# Patient Record
Sex: Female | Born: 1970
Health system: Southern US, Community
[De-identification: ages and names within clinical notes are randomized; demographics above are authoritative.]

## PROBLEM LIST (undated history)

## (undated) DIAGNOSIS — I8390 Asymptomatic varicose veins of unspecified lower extremity: Secondary | ICD-10-CM

## (undated) DIAGNOSIS — I491 Atrial premature depolarization: Secondary | ICD-10-CM

## (undated) DIAGNOSIS — I10 Essential (primary) hypertension: Secondary | ICD-10-CM

## (undated) HISTORY — DX: Atrial premature depolarization: I49.1

## (undated) HISTORY — DX: Essential (primary) hypertension: I10

## (undated) HISTORY — DX: Asymptomatic varicose veins of unspecified lower extremity: I83.90

---

## 2004-03-14 ENCOUNTER — Emergency Department: Payer: Self-pay | Admitting: Emergency Medicine

## 2006-01-17 ENCOUNTER — Emergency Department: Payer: Self-pay | Admitting: Emergency Medicine

## 2008-11-04 DIAGNOSIS — I10 Essential (primary) hypertension: Secondary | ICD-10-CM | POA: Insufficient documentation

## 2012-02-02 ENCOUNTER — Emergency Department: Payer: Self-pay | Admitting: Emergency Medicine

## 2012-02-02 LAB — CBC
HCT: 42.2 % (ref 35.0–47.0)
HGB: 14.7 g/dL (ref 12.0–16.0)
MCH: 33.9 pg (ref 26.0–34.0)
MCHC: 34.9 g/dL (ref 32.0–36.0)
MCV: 97 fL (ref 80–100)
RDW: 12.3 % (ref 11.5–14.5)

## 2012-02-02 LAB — COMPREHENSIVE METABOLIC PANEL
Alkaline Phosphatase: 62 U/L (ref 50–136)
Anion Gap: 6 — ABNORMAL LOW (ref 7–16)
BUN: 9 mg/dL (ref 7–18)
Bilirubin,Total: 0.9 mg/dL (ref 0.2–1.0)
Chloride: 104 mmol/L (ref 98–107)
Co2: 28 mmol/L (ref 21–32)
Creatinine: 0.82 mg/dL (ref 0.60–1.30)
EGFR (African American): >60
EGFR (Non-African Amer.): >60
Osmolality: 274 (ref 275–301)
Potassium: 3.4 mmol/L — ABNORMAL LOW (ref 3.5–5.1)
SGOT(AST): 22 U/L (ref 15–37)
Sodium: 138 mmol/L (ref 136–145)
Total Protein: 7.6 g/dL (ref 6.4–8.2)

## 2012-02-02 LAB — TROPONIN I: Troponin-I: <0.02 ng/mL

## 2012-02-02 LAB — CK TOTAL AND CKMB (NOT AT ARMC)
CK, Total: 50 U/L (ref 21–215)
CK-MB: <0.5 ng/mL — ABNORMAL LOW (ref 0.5–3.6)

## 2012-02-05 ENCOUNTER — Telehealth: Payer: Self-pay

## 2012-02-05 ENCOUNTER — Ambulatory Visit (INDEPENDENT_AMBULATORY_CARE_PROVIDER_SITE_OTHER): Payer: PRIVATE HEALTH INSURANCE | Admitting: Cardiovascular Disease

## 2012-02-05 ENCOUNTER — Encounter: Payer: Self-pay | Admitting: Cardiovascular Disease

## 2012-02-05 VITALS — BP 140/90 | HR 72 | Ht 64.0 in | Wt 132.2 lb

## 2012-02-05 DIAGNOSIS — R000 Tachycardia, unspecified: Secondary | ICD-10-CM

## 2012-02-05 DIAGNOSIS — R0789 Other chest pain: Secondary | ICD-10-CM

## 2012-02-05 DIAGNOSIS — R0602 Shortness of breath: Secondary | ICD-10-CM

## 2012-02-05 DIAGNOSIS — R6889 Other general symptoms and signs: Secondary | ICD-10-CM

## 2012-02-05 DIAGNOSIS — F411 Generalized anxiety disorder: Secondary | ICD-10-CM

## 2012-02-05 DIAGNOSIS — F419 Anxiety disorder, unspecified: Secondary | ICD-10-CM | POA: Insufficient documentation

## 2012-02-05 DIAGNOSIS — R0989 Other specified symptoms and signs involving the circulatory and respiratory systems: Secondary | ICD-10-CM

## 2012-02-05 MED ORDER — PROPRANOLOL HCL 20 MG PO TABS: 20.0000 mg | ORAL_TABLET | Freq: Three times a day (TID) | ORAL | Status: DC | PRN

## 2012-02-05 NOTE — Assessment & Plan Note (Signed)
By her description, less concerning for arrhythmia, more consistent with atrial tachycardia from anxiety. We will try low-dose beta blocker as needed.

## 2012-02-05 NOTE — Telephone Encounter (Signed)
Pt is a new pt to [place], ER f/u Has appt with Dr. Kirke Corin 12/3 She is asking if she can be seen sooner than this Says she went back to ER for CP and palpitations Says she is ok with waiting until 12/3 but was wanting to know if we had anything sooner Denies active CP Admits to a "pinching" sensation Offered her an appt with Dr. Kirke Corin tomm at 2 pm in [place] She asks if Dr. Mariah Milling has anything here in [place] I had her hold while we asked Dr. Mariah Milling when he clould see pt appt was made available for pt today at 2 pm She confirms and will come for this appt

## 2012-02-05 NOTE — Assessment & Plan Note (Signed)
I suspect most of her symptoms are secondary to anxiety. She has significant stress including home stress, work stress, now with school stress. We have suggested she take propranolol as needed for palpitations/tachycardia and shortness of breath. If symptoms get worse, she may benefit from having Xanax or Ativan when necessary. We have suggested she reconsider taking 5 classes next semester. This semester she is only taking 3 classes and it is very stressful.

## 2012-02-05 NOTE — Assessment & Plan Note (Signed)
Shortness of breath seems to be periodic, situational, associated with stress/anxiety and faster heartbeat. No further workup at this time. We did offer Holter and echocardiogram. We could do these at a later date if symptoms get worse. We have suggested she try low-dose beta blocker/propranolol when necessary.

## 2012-02-05 NOTE — Progress Notes (Signed)
Patient ID: Erin [name], female    DOB: 04/27/1970, 41 y.o.   MRN: [id number]  HPI Comments: Erin [name] is a very pleasant 41 year old woman with chronic lurched remedy varicose veins, anxiety who presents by referral from Dr. Carlynn [name] for evaluation of shortness of breath and palpitations.  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 She has significant stress from her schedule. She would like to get good grades and is working very hard.  She reports having episodes of heart racing sometimes lasting up to one hour. The fast heart rate seems to be associated with shortness of breath. Occasional palpitations. It seems like her symptoms are happening more and more, now on a daily basis. She does report stress from school seems to be getting worse. She also describes a pinching in her chest, sometimes in her back.  She's able to exert herself without any significant difficulty. No chest pain or shortness of breath with normal exertion. She reports her cholesterol is relatively well controlled. No smoking, no diabetes.  She reports her brother had heart attack at age 81, father with MI in his 5s, mother died early from cancer, grandmother lived into her 63s.  EKG shows normal sinus rhythm with rate 73 beats a minute with no significant ST or T wave changes   Outpatient Encounter Prescriptions as of 02/05/2012  Medication Sig Dispense Refill  . esomeprazole (NEXIUM) 40 MG capsule Take 40 mg by mouth daily before breakfast.      . hydrochlorothiazide (MICROZIDE) 12.5 MG capsule Take 12.5 mg by mouth daily.      . Multiple Vitamin (MULTIVITAMIN) tablet Take 1 tablet by mouth daily.        Review of Systems  Constitutional: Negative.   HENT: Negative.   Eyes: Negative.   Respiratory: Positive for shortness of breath.   Cardiovascular: Positive for palpitations.  Gastrointestinal: Negative.     Musculoskeletal: Negative.   Skin: Negative.   Neurological: Negative.   Hematological: Negative.   Psychiatric/Behavioral: The patient is nervous/anxious.   All other systems reviewed and are negative.    BP 140/90  Pulse 72  Ht 5\' 4"  (1.626 m)  Wt 132 lb 4 oz (59.988 kg)  BMI 22.70 kg/m2  Physical Exam  Nursing note and vitals reviewed. Constitutional: She is oriented to person, place, and time. She appears well-developed and well-nourished.  HENT:  Head: Normocephalic.  Nose: Nose normal.  Mouth/Throat: Oropharynx is clear and moist.  Eyes: Conjunctivae normal are normal. Pupils are equal, round, and reactive to light.  Neck: Normal range of motion. Neck supple. No JVD present.  Cardiovascular: Normal rate, regular rhythm, S1 normal, S2 normal, normal heart sounds and intact distal pulses.  Exam reveals no gallop and no friction rub.   No murmur heard. Pulmonary/Chest: Effort normal and breath sounds normal. No respiratory distress. She has no wheezes. She has no rales. She exhibits no tenderness.  Abdominal: Soft. Bowel sounds are normal. She exhibits no distension. There is no tenderness.  Musculoskeletal: Normal range of motion. She exhibits no edema and no tenderness.  Lymphadenopathy:    She has no cervical adenopathy.  Neurological: She is alert and oriented to person, place, and time. Coordination normal.  Skin: Skin is warm and dry. No rash noted. No erythema.  Psychiatric: She has a normal mood and affect. Her behavior is normal. Judgment and thought content normal.  Assessment and Plan

## 2012-02-05 NOTE — Patient Instructions (Addendum)
Please start propranolol as needed every 6 hours for fast heart rates Please try a 1/2 to start  Call the office if you would like a holter monitor to treadmill test or other work up  Please call us if you have new issues that need to be addressed before your next appt.

## 2012-02-12 ENCOUNTER — Ambulatory Visit: Payer: Self-pay | Admitting: Cardiovascular Disease

## 2012-05-05 LAB — HM PAP SMEAR: HM PAP: NORMAL

## 2014-12-23 ENCOUNTER — Ambulatory Visit: Payer: Self-pay | Admitting: Family Medicine

## 2015-02-15 ENCOUNTER — Encounter: Payer: Self-pay | Admitting: Family Medicine

## 2015-03-03 ENCOUNTER — Ambulatory Visit: Payer: Self-pay | Admitting: Family Medicine

## 2015-05-27 ENCOUNTER — Ambulatory Visit (INDEPENDENT_AMBULATORY_CARE_PROVIDER_SITE_OTHER): Payer: 59 | Admitting: Family Medicine

## 2015-05-27 ENCOUNTER — Encounter: Payer: Self-pay | Admitting: Family Medicine

## 2015-05-27 VITALS — BP 142/78 | HR 87 | Temp 97.8°F | Resp 18 | Ht 62.0 in | Wt 134.7 lb

## 2015-05-27 DIAGNOSIS — Z0000 Encounter for general adult medical examination without abnormal findings: Secondary | ICD-10-CM | POA: Diagnosis not present

## 2015-05-27 DIAGNOSIS — Z8679 Personal history of other diseases of the circulatory system: Secondary | ICD-10-CM | POA: Insufficient documentation

## 2015-05-27 DIAGNOSIS — Z1239 Encounter for other screening for malignant neoplasm of breast: Secondary | ICD-10-CM

## 2015-05-27 DIAGNOSIS — M797 Fibromyalgia: Secondary | ICD-10-CM

## 2015-05-27 DIAGNOSIS — M542 Cervicalgia: Secondary | ICD-10-CM | POA: Diagnosis not present

## 2015-05-27 DIAGNOSIS — Z13220 Encounter for screening for lipoid disorders: Secondary | ICD-10-CM | POA: Diagnosis not present

## 2015-05-27 DIAGNOSIS — I491 Atrial premature depolarization: Secondary | ICD-10-CM | POA: Insufficient documentation

## 2015-05-27 DIAGNOSIS — D229 Melanocytic nevi, unspecified: Secondary | ICD-10-CM

## 2015-05-27 DIAGNOSIS — Z124 Encounter for screening for malignant neoplasm of cervix: Secondary | ICD-10-CM

## 2015-05-27 DIAGNOSIS — I10 Essential (primary) hypertension: Secondary | ICD-10-CM | POA: Diagnosis not present

## 2015-05-27 DIAGNOSIS — E559 Vitamin D deficiency, unspecified: Secondary | ICD-10-CM | POA: Diagnosis not present

## 2015-05-27 DIAGNOSIS — G8929 Other chronic pain: Secondary | ICD-10-CM | POA: Diagnosis not present

## 2015-05-27 DIAGNOSIS — Z01419 Encounter for gynecological examination (general) (routine) without abnormal findings: Secondary | ICD-10-CM

## 2015-05-27 MED ORDER — METAXALONE 800 MG PO TABS: 800.0000 mg | ORAL_TABLET | Freq: Three times a day (TID) | ORAL | Status: DC

## 2015-05-27 MED ORDER — HYDROCHLOROTHIAZIDE 12.5 MG PO CAPS: 12.5000 mg | ORAL_CAPSULE | Freq: Every day | ORAL | Status: DC

## 2015-05-27 NOTE — Progress Notes (Signed)
Name: Erin [name]   MRN: [id number]    DOB: 07/20/1970   Date:05/27/2015       Progress Note  Subjective  Chief Complaint  Chief Complaint  Patient presents with  . Annual Exam  . Hypertension    HPI  Well Woman: she denies vaginal discharge, she states only occasionally when she sneezes she may have a little incontinence. No dyspareunia. No breast lumps  HTN: she has been out of medication for months now, she checks her bp occasionally and it can up to 160's when she is upset. She states palpitation has resolved, no longer taking Propanolol  Chronic neck pain: she has been taking Baclofen prn, but the stiffness is also during the day, constant discomfort sensation on right side of the neck, worse when she turns head to the right side, she denies numbness or tingling. No weakness on her arm. No trauma  Family history of melanoma: mother died at age 80 from complications of melanoma. Raised by grandmother until father remarried , after that lived with step-mother and father.   Patient Active Problem List   Diagnosis Date Noted  . APC (atrial premature contractions) 05/27/2015  . History of varicose veins 05/27/2015  . Vitamin D deficiency 05/27/2015  . Chronic neck pain 05/27/2015  . Fibromyalgia 05/27/2015  . Tachycardia 02/05/2012  . Essential (primary) hypertension 11/04/2008    Past Surgical History  Procedure Laterality Date  . Cesarean section      Family History  Problem Relation Age of Onset  . Heart attack Father 18  . Hypertension Father   . Heart attack Brother 80    s/p stent   . Hyperlipidemia Brother   . Hypertension Brother   . Melanoma Mother     Social History   Social History  . Marital Status: Married    Spouse Name: N/A  . Number of Children: N/A  . Years of Education: N/A   Occupational History  . Not on file.   Social History Main Topics  . Smoking status: Never Smoker   . Smokeless tobacco: Never Used  . Alcohol Use: No  .  Drug Use: No  . Sexual Activity:    Partners: Male   Other Topics Concern  . Not on file   Social History Narrative     Current outpatient prescriptions:  .  hydrochlorothiazide (MICROZIDE) 12.5 MG capsule, Take 1 capsule (12.5 mg total) by mouth daily., Disp: 90 capsule, Rfl: 1 .  Multiple Vitamin (MULTIVITAMIN) tablet, Take 1 tablet by mouth daily., Disp: , Rfl:  .  metaxalone (SKELAXIN) 800 MG tablet, Take 1 tablet (800 mg total) by mouth 3 (three) times daily., Disp: 90 tablet, Rfl: 2  No Known Allergies   ROS  Constitutional: Negative for fever or weight change.  Respiratory: Negative for cough and shortness of breath.   Cardiovascular: Negative for chest pain or palpitations.  Gastrointestinal: Negative for abdominal pain, no bowel changes.  Musculoskeletal: Negative for gait problem or joint swelling.  Skin: Negative for rash.  Neurological: Negative for dizziness or headache.  No other specific complaints in a complete review of systems (except as listed in HPI above).  Objective  Filed Vitals:   05/27/15 1411  BP: 142/78  Pulse: 87  Temp: 97.8 F (36.6 C)  TempSrc: Oral  Resp: 18  Height: 5\' 2"  (1.575 m)  Weight: 134 lb 11.2 oz (61.1 kg)  SpO2: 96%    Body mass index is 24.63 kg/(m^2).  Physical Exam  Constitutional: Patient appears well-developed and well-nourished. No distress.  HENT: Head: Normocephalic and atraumatic. Ears: B TMs ok, no erythema or effusion; Nose: Nose normal. Mouth/Throat: Oropharynx is clear and moist. No oropharyngeal exudate.  Eyes: Conjunctivae and EOM are normal. Pupils are equal, round, and reactive to light. No scleral icterus.  Neck: Normal range of motion. Neck supple. No JVD present. No thyromegaly present.  Cardiovascular: Normal rate, regular rhythm and normal heart sounds.  No murmur heard. No BLE edema. Pulmonary/Chest: Effort normal and breath sounds normal. No respiratory distress. Abdominal: Soft. Bowel sounds are  normal, no distension. There is no tenderness. no masses Breast: no lumps or masses, no nipple discharge or rashes FEMALE GENITALIA:  External genitalia normal External urethra normal Vaginal vault normal without discharge or lesions Cervix normal without discharge or lesions Bimanual exam normal without masses RECTAL: not done Musculoskeletal: Normal range of motion, no joint effusions. No gross deformities Neurological: he is alert and oriented to person, place, and time. No cranial nerve deficit. Coordination, balance, strength, speech and gait are normal.  Skin: Skin is warm and dry. No rash noted. No erythema. She has multiple moles Psychiatric: Patient has a normal mood and affect. behavior is normal. Judgment and thought content normal.   PHQ2/9: Depression screen PHQ 2/9 05/27/2015  Decreased Interest 0  Down, Depressed, Hopeless 0  PHQ - 2 Score 0     Fall Risk: Fall Risk  05/27/2015  Falls in the past year? No    Functional Status Survey: Is the patient deaf or have difficulty hearing?: No Does the patient have difficulty seeing, even when wearing glasses/contacts?: No Does the patient have difficulty concentrating, remembering, or making decisions?: No Does the patient have difficulty walking or climbing stairs?: No Does the patient have difficulty dressing or bathing?: No Does the patient have difficulty doing errands alone such as visiting a doctor's office or shopping?: No    Assessment & Plan  1. Well woman exam  Discussed importance of 150 minutes of physical activity weekly, eat two servings of fish weekly, eat one serving of tree nuts ( cashews, pistachios, pecans, almonds.Marland Kitchen) every other day, eat 6 servings of fruit/vegetables daily and drink plenty of water and avoid sweet beverages.  I also discussed importance of dairy intake and symptoms of menopause  2. Chronic neck pain  - metaxalone (SKELAXIN) 800 MG tablet; Take 1 tablet (800 mg total) by mouth 3  (three) times daily.  Dispense: 90 tablet; Refill: 2 - Ambulatory referral to Chiropractic  3. Essential (primary) hypertension  - hydrochlorothiazide (MICROZIDE) 12.5 MG capsule; Take 1 capsule (12.5 mg total) by mouth daily.  Dispense: 90 capsule; Refill: 1 - CBC with Differential/Platelet - Comprehensive metabolic panel  4. Vitamin D deficiency  - VITAMIN D 25 Hydroxy (Vit-D Deficiency, Fractures)  5. Fibromyalgia  - metaxalone (SKELAXIN) 800 MG tablet; Take 1 tablet (800 mg total) by mouth 3 (three) times daily.  Dispense: 90 tablet; Refill: 2  6. Cervical cancer screening  - Pap IG, CT/NG NAA, and HPV (high risk)  7. Lipid screening  - Lipid panel  8. Nevus  - Ambulatory referral to Dermatology  9. Breast cancer screening  - MM Digital Screening; Future

## 2015-06-01 LAB — PAP IG, CT-NG NAA, HPV HIGH-RISK
CHLAMYDIA, NUC. ACID AMP: NEGATIVE
Gonococcus by Nucleic Acid Amp: NEGATIVE
HPV, high-risk: NEGATIVE
PAP SMEAR COMMENT: 0

## 2015-06-30 ENCOUNTER — Telehealth: Payer: Self-pay | Admitting: Family Medicine

## 2015-06-30 MED ORDER — BACLOFEN 20 MG PO TABS: 20.0000 mg | ORAL_TABLET | Freq: Three times a day (TID) | ORAL | Status: DC

## 2015-06-30 NOTE — Telephone Encounter (Signed)
done

## 2015-06-30 NOTE — Telephone Encounter (Signed)
Pt called wanting to know if she can be switched back to the Baclofen. Pt states what she is currently on she does not the way it makes her feel. Please advise.

## 2015-10-24 ENCOUNTER — Encounter: Payer: Self-pay | Admitting: Family Medicine

## 2015-10-24 ENCOUNTER — Ambulatory Visit (INDEPENDENT_AMBULATORY_CARE_PROVIDER_SITE_OTHER): Payer: 59 | Admitting: Family Medicine

## 2015-10-24 VITALS — BP 118/78 | HR 94 | Temp 98.0°F | Resp 18 | Wt 126.4 lb

## 2015-10-24 DIAGNOSIS — R300 Dysuria: Secondary | ICD-10-CM | POA: Diagnosis not present

## 2015-10-24 LAB — POCT URINALYSIS DIPSTICK
Bilirubin, UA: NEGATIVE
Glucose, UA: NEGATIVE
KETONES UA: NEGATIVE
LEUKOCYTES UA: NEGATIVE
Nitrite, UA: NEGATIVE
PH UA: 5.5
PROTEIN UA: NEGATIVE
RBC UA: NEGATIVE
SPEC GRAV UA: 1.01
Urobilinogen, UA: 2

## 2015-10-24 MED ORDER — CIPROFLOXACIN HCL 250 MG PO TABS: 250.0000 mg | ORAL_TABLET | Freq: Two times a day (BID) | ORAL | 0 refills | Status: DC

## 2015-10-24 NOTE — Addendum Note (Signed)
Addended by: Lolita Rieger D on: 10/24/2015 04:04 PM   Modules accepted: Orders

## 2015-10-24 NOTE — Progress Notes (Signed)
Name: Erin [name]   MRN: [id number]    DOB: 26-Sep-1970   Date:10/24/2015       Progress Note  Subjective  Chief Complaint  Chief Complaint  Patient presents with  . Dysuria    painful,frequency and lower back pain for 2 weeks.    HPI  Dysuria: she states that symptoms started suddenly about 2 weeks ago. 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 She denies fever, nausea or vomiting, but she has some pelvic discomfort and some low back pain intermittently. Married and only noticed mild vaginal discharge, nothing out of normal.   Patient Active Problem List   Diagnosis Date Noted  . APC (atrial premature contractions) 05/27/2015  . History of varicose veins 05/27/2015  . Vitamin D deficiency 05/27/2015  . Chronic neck pain 05/27/2015  . Fibromyalgia 05/27/2015  . Tachycardia 02/05/2012  . Essential (primary) hypertension 11/04/2008    Past Surgical History:  Procedure Laterality Date  . CESAREAN SECTION      Family History  Problem Relation Age of Onset  . Heart attack Father 39  . Hypertension Father   . Heart attack Brother 74    s/p stent   . Hyperlipidemia Brother   . Hypertension Brother   . Melanoma Mother     Social History   Social History  . Marital status: Married    Spouse name: N/A  . Number of children: N/A  . Years of education: N/A   Occupational History  . Not on file.   Social History Main Topics  . Smoking status: Never Smoker  . Smokeless tobacco: Never Used  . Alcohol use No  . Drug use: No  . Sexual activity: Yes    Partners: Male   Other Topics Concern  . Not on file   Social History Narrative  . No narrative on file     Current Outpatient Prescriptions:  .  baclofen (LIORESAL) 20 MG tablet, Take 1 tablet (20 mg total) by mouth 3 (three) times daily., Disp: 90 each, Rfl: 0 .  hydrochlorothiazide  (MICROZIDE) 12.5 MG capsule, Take 1 capsule (12.5 mg total) by mouth daily., Disp: 90 capsule, Rfl: 1 .  Multiple Vitamin (MULTIVITAMIN) tablet, Take 1 tablet by mouth daily., Disp: , Rfl:  .  ciprofloxacin (CIPRO) 250 MG tablet, Take 1 tablet (250 mg total) by mouth 2 (two) times daily., Disp: 10 tablet, Rfl: 0  No Known Allergies   ROS  Ten systems reviewed and is negative except as mentioned in HPI   Objective  Vitals:   10/24/15 1512  BP: 118/78  Pulse: 94  Resp: 18  Temp: 98 F (36.7 C)  SpO2: 95%  Weight: 126 lb 7 oz (57.4 kg)    Body mass index is 23.13 kg/m.  Physical Exam  Constitutional: Patient appears well-developed and well-nourished. No distress.  HEENT: head atraumatic, normocephalic, pupils equal and reactive to light,  neck supple, throat within normal limits Cardiovascular: Normal rate, regular rhythm and normal heart sounds.  No murmur heard. No BLE edema. Pulmonary/Chest: Effort normal and breath sounds normal. No respiratory distress. Abdominal: Soft.  There is no tenderness. Negative CVA Psychiatric: Patient has a normal mood and affect. behavior is normal. Judgment and thought content normal.   Recent Results (from the past 2160 hour(s))  POCT urinalysis dipstick     Status: None  Collection Time: 10/24/15  3:21 PM  Result Value Ref Range   Color, UA yellow    Clarity, UA clear    Glucose, UA neg    Bilirubin, UA neg    Ketones, UA neg    Spec Grav, UA 1.010    Blood, UA neg    pH, UA 5.5    Protein, UA neg    Urobilinogen, UA 2.0    Nitrite, UA neg    Leukocytes, UA Negative Negative    PHQ2/9: Depression screen PHQ 2/9 05/27/2015  Decreased Interest 0  Down, Depressed, Hopeless 0  PHQ - 2 Score 0     Fall Risk: Fall Risk  05/27/2015  Falls in the past year? No     Assessment & Plan  1. Dysuria  - POCT urinalysis dipstick - CULTURE, URINE COMPREHENSIVE - Chlamydia/Gonococcus/Trichomonas, NAA - ciprofloxacin (CIPRO) 250  MG tablet; Take 1 tablet (250 mg total) by mouth 2 (two) times daily.  Dispense: 10 tablet; Refill: 0

## 2015-11-16 LAB — CHLAMYDIA/GONOCOCCUS/TRICHOMONAS, NAA
Chlamydia by NAA: NEGATIVE
Gonococcus by NAA: NEGATIVE
Trich vag by NAA: NEGATIVE

## 2015-11-16 LAB — CULTURE, URINE COMPREHENSIVE

## 2015-11-16 LAB — SPECIMEN STATUS REPORT

## 2015-11-28 ENCOUNTER — Ambulatory Visit: Payer: 59 | Admitting: Family Medicine

## 2016-06-04 ENCOUNTER — Encounter: Payer: 59 | Admitting: Family Medicine

## 2016-08-27 ENCOUNTER — Other Ambulatory Visit: Payer: Self-pay | Admitting: Family Medicine

## 2016-08-27 DIAGNOSIS — Z1231 Encounter for screening mammogram for malignant neoplasm of breast: Secondary | ICD-10-CM

## 2016-08-29 ENCOUNTER — Encounter: Payer: 59 | Admitting: Family Medicine

## 2016-09-05 ENCOUNTER — Ambulatory Visit
Admission: RE | Admit: 2016-09-05 | Discharge: 2016-09-05 | Disposition: A | Discharge status: Home / Self Care | Payer: BLUE CROSS/BLUE SHIELD | Source: Ambulatory Visit | Attending: Family Medicine | Admitting: Family Medicine

## 2016-09-05 DIAGNOSIS — Z1231 Encounter for screening mammogram for malignant neoplasm of breast: Secondary | ICD-10-CM | POA: Diagnosis not present

## 2016-10-07 ENCOUNTER — Other Ambulatory Visit: Payer: Self-pay

## 2016-10-07 ENCOUNTER — Encounter: Payer: Self-pay | Admitting: Emergency Medicine

## 2016-10-07 ENCOUNTER — Emergency Department: Payer: BLUE CROSS/BLUE SHIELD

## 2016-10-07 ENCOUNTER — Emergency Department
Admission: EM | Admit: 2016-10-07 | Discharge: 2016-10-07 | Disposition: A | Discharge status: Home / Self Care | Payer: BLUE CROSS/BLUE SHIELD | Attending: Emergency Medicine | Admitting: Emergency Medicine

## 2016-10-07 DIAGNOSIS — Z79899 Other long term (current) drug therapy: Secondary | ICD-10-CM | POA: Diagnosis not present

## 2016-10-07 DIAGNOSIS — I10 Essential (primary) hypertension: Secondary | ICD-10-CM | POA: Diagnosis not present

## 2016-10-07 DIAGNOSIS — R079 Chest pain, unspecified: Secondary | ICD-10-CM | POA: Diagnosis not present

## 2016-10-07 DIAGNOSIS — R0789 Other chest pain: Secondary | ICD-10-CM | POA: Diagnosis not present

## 2016-10-07 DIAGNOSIS — R0602 Shortness of breath: Secondary | ICD-10-CM

## 2016-10-07 DIAGNOSIS — I491 Atrial premature depolarization: Secondary | ICD-10-CM | POA: Insufficient documentation

## 2016-10-07 LAB — BASIC METABOLIC PANEL
ANION GAP: 7 (ref 5–15)
BUN: 19 mg/dL (ref 6–20)
CHLORIDE: 103 mmol/L (ref 101–111)
CO2: 27 mmol/L (ref 22–32)
Calcium: 9 mg/dL (ref 8.9–10.3)
Creatinine, Ser: 0.96 mg/dL (ref 0.44–1.00)
GFR calc Af Amer: >60 mL/min (ref >60)
Glucose, Bld: 116 mg/dL — ABNORMAL HIGH (ref 65–99)
POTASSIUM: 3.5 mmol/L (ref 3.5–5.1)
Sodium: 137 mmol/L (ref 135–145)

## 2016-10-07 LAB — CBC
HEMATOCRIT: 38.9 % (ref 35.0–47.0)
HEMOGLOBIN: 13.6 g/dL (ref 12.0–16.0)
MCH: 34 pg (ref 26.0–34.0)
MCHC: 35.1 g/dL (ref 32.0–36.0)
MCV: 96.9 fL (ref 80.0–100.0)
Platelets: 208 10*3/uL (ref 150–440)
RBC: 4.01 MIL/uL (ref 3.80–5.20)
RDW: 12.3 % (ref 11.5–14.5)
WBC: 8.2 10*3/uL (ref 3.6–11.0)

## 2016-10-07 LAB — TROPONIN I: Troponin I: <0.03 ng/mL (ref <0.03)

## 2016-10-07 NOTE — ED Provider Notes (Signed)
[facility] Emergency Department Provider Note   ____________________________________________   First MD Initiated Contact with Patient 10/07/16 [phone]     (approximate)  I have reviewed the triage vital signs and the nursing notes.   HISTORY  Chief Complaint Chest Pain and Shortness of Breath    HPI Erin [name] is a 46 y.o. female who comes into the hospital today with some chest tightness and shortness of breath. The patient reports that she woke up around midnight with some elevated blood pressure and tightness and pressure in her chest and some shortness of breath. The patient reports that she felt like she was having palpitations. She became panicked and then decided to come into the hospital. The patient reports that the symptoms lasted about 2-3 hours. She has had panic attacks that were similar in the past but they just never woke her up out of her sleep. The patient does have a history of blood pressure and reports that she is going to be seeing her primary care physician to help get it under better control. The patient took 2 aspirin and came in for evaluation. She reports that her blood pressures typically in the 140s to 150s over 80s. She decided to come in for evaluation. She reports that currently she is in no pain in her breathing feels improved.   Past Medical History:  Diagnosis Date  . Hypertension   . Supraventricular premature beats   . Vein, varicose     Patient Active Problem List   Diagnosis Date Noted  . APC (atrial premature contractions) 05/27/2015  . History of varicose veins 05/27/2015  . Vitamin D deficiency 05/27/2015  . Chronic neck pain 05/27/2015  . Fibromyalgia 05/27/2015  . Tachycardia 02/05/2012  . Essential (primary) hypertension 11/04/2008    Past Surgical History:  Procedure Laterality Date  . CESAREAN SECTION      Prior to Admission medications   Medication Sig Start Date End Date Taking?  Authorizing Provider  baclofen (LIORESAL) 20 MG tablet Take 1 tablet (20 mg total) by mouth 3 (three) times daily. 06/30/15   Steele Sizer, MD  ciprofloxacin (CIPRO) 250 MG tablet Take 1 tablet (250 mg total) by mouth 2 (two) times daily. 10/24/15   Steele Sizer, MD  hydrochlorothiazide (MICROZIDE) 12.5 MG capsule Take 1 capsule (12.5 mg total) by mouth daily. 05/27/15   Steele Sizer, MD  Multiple Vitamin (MULTIVITAMIN) tablet Take 1 tablet by mouth daily.    [provider]    Allergies Patient has no known allergies.  Family History  Problem Relation Age of Onset  . Heart attack Father 71  . Hypertension Father   . Heart attack Brother 63       s/p stent   . Hyperlipidemia Brother   . Hypertension Brother   . Melanoma Mother     Social History Social History  Substance Use Topics  . Smoking status: Never Smoker  . Smokeless tobacco: Never Used  . Alcohol use No    Review of Systems  Constitutional: No fever/chills Eyes: No visual changes. ENT: No sore throat. Cardiovascular: Denies chest tightness and pressure Respiratory: shortness of breath. Gastrointestinal: No abdominal pain.  No nausea, no vomiting.  No diarrhea.  No constipation. Genitourinary: Negative for dysuria. Musculoskeletal: Negative for back pain. Skin: Negative for rash. Neurological: Negative for headaches, focal weakness or numbness.   ____________________________________________   PHYSICAL EXAM:  VITAL SIGNS: ED Triage Vitals  Enc Vitals Group     BP  10/07/16 0046 (!) 181/106     Pulse Rate 10/07/16 0046 96     Resp 10/07/16 0046 16     Temp 10/07/16 0046 97.6 F (36.4 C)     Temp Source 10/07/16 0046 Oral     SpO2 10/07/16 0046 100 %     Weight 10/07/16 0046 138 lb (62.6 kg)     Height 10/07/16 0046 5\' 3"  (1.6 m)     Head Circumference --      Peak Flow --      Pain Score 10/07/16 0106 3     Pain Loc --      Pain Edu? --      Excl. in [place]? --     Constitutional:  Alert and oriented. Well appearing and in no acute distress. Eyes: Conjunctivae are normal. PERRL. EOMI. Head: Atraumatic. Nose: No congestion/rhinnorhea. Mouth/Throat: Mucous membranes are moist.  Oropharynx non-erythematous. Cardiovascular: Normal rate, regular rhythm. Grossly normal heart sounds.  Good peripheral circulation. Respiratory: Normal respiratory effort.  No retractions. Lungs CTAB. Gastrointestinal: Soft and nontender. No distention. Positive bowel sounds Musculoskeletal: No lower extremity tenderness nor edema.  Neurologic:  Normal speech and language.  Skin:  Skin is warm, dry and intact.  Psychiatric: Mood and affect are normal.   ____________________________________________   LABS (all labs ordered are listed, but only abnormal results are displayed)  Labs Reviewed  BASIC METABOLIC PANEL - Abnormal; Notable for the following:       Result Value   Glucose, Bld 116 (*)    All other components within normal limits  CBC  TROPONIN I   ____________________________________________  EKG  ED ECG REPORT I, Loney Hering, the attending physician, personally viewed and interpreted this ECG.   Date: 10/07/2016  EKG Time: 0039  Rate: 92  Rhythm: normal sinus rhythm  Axis: normal  Intervals:none  ST&T Change: none  ____________________________________________  RADIOLOGY  Dg Chest 2 View  Result Date: 10/07/2016 CLINICAL DATA:  Chest pressure and dyspnea, onset at midnight. EXAM: CHEST  2 VIEW COMPARISON:  02/02/2012 FINDINGS: The lungs are clear. The pulmonary vasculature is normal. Heart size is normal. Hilar and mediastinal contours are unremarkable. There is no pleural effusion. IMPRESSION: No active cardiopulmonary disease. Electronically Signed   By: Andreas Newport M.D.   On: 10/07/2016 02:01    ____________________________________________   PROCEDURES  Procedure(s) performed: None  Procedures  Critical Care performed:  No  ____________________________________________   INITIAL IMPRESSION / ASSESSMENT AND PLAN / ED COURSE  Pertinent labs & imaging results that were available during my care of the patient were reviewed by me and considered in my medical decision making (see chart for details).  This is a 46 year old female who came into the hospital today with some shortness of breath and chest tightness. The patient reports that it took some time for everything to go away but she feels improved right now. She states that she's had panic attacks that feel this way and since she feels better she does not want any further studies performed. I informed her that her blood work is unremarkable but typically with chest discomfort and shortness of breath we do repeat troponin to make sure that there is no signs of ischemia. The patient states that she does not want any studies done and she will follow up with her primary care physician. The patient does have flipped T waves but she did have them in 2013. She'll be discharged home.      ____________________________________________  FINAL CLINICAL IMPRESSION(S) / ED DIAGNOSES  Final diagnoses:  Shortness of breath  Chest pain, unspecified type      NEW MEDICATIONS STARTED DURING THIS VISIT:  New Prescriptions   No medications on file     Note:  This document was prepared using Dragon voice recognition software and may include unintentional dictation errors.    Loney Hering, MD 10/07/16 [phone]

## 2016-10-07 NOTE — ED Triage Notes (Signed)
Patient with complaint of chest pressure and shortness of breath that started at midnight.

## 2016-10-10 ENCOUNTER — Ambulatory Visit (INDEPENDENT_AMBULATORY_CARE_PROVIDER_SITE_OTHER): Payer: BLUE CROSS/BLUE SHIELD | Admitting: Family Medicine

## 2016-10-10 ENCOUNTER — Encounter: Payer: Self-pay | Admitting: Family Medicine

## 2016-10-10 VITALS — BP 132/68 | HR 92 | Temp 98.5°F | Resp 16 | Ht 63.3 in | Wt 141.0 lb

## 2016-10-10 DIAGNOSIS — F410 Panic disorder [episodic paroxysmal anxiety] without agoraphobia: Secondary | ICD-10-CM | POA: Insufficient documentation

## 2016-10-10 DIAGNOSIS — Z1239 Encounter for other screening for malignant neoplasm of breast: Secondary | ICD-10-CM

## 2016-10-10 DIAGNOSIS — E559 Vitamin D deficiency, unspecified: Secondary | ICD-10-CM

## 2016-10-10 DIAGNOSIS — Z01419 Encounter for gynecological examination (general) (routine) without abnormal findings: Secondary | ICD-10-CM | POA: Diagnosis not present

## 2016-10-10 DIAGNOSIS — Z1231 Encounter for screening mammogram for malignant neoplasm of breast: Secondary | ICD-10-CM

## 2016-10-10 DIAGNOSIS — Z13220 Encounter for screening for lipoid disorders: Secondary | ICD-10-CM

## 2016-10-10 DIAGNOSIS — Z124 Encounter for screening for malignant neoplasm of cervix: Secondary | ICD-10-CM | POA: Diagnosis not present

## 2016-10-10 DIAGNOSIS — Z131 Encounter for screening for diabetes mellitus: Secondary | ICD-10-CM

## 2016-10-10 DIAGNOSIS — R000 Tachycardia, unspecified: Secondary | ICD-10-CM | POA: Diagnosis not present

## 2016-10-10 DIAGNOSIS — I10 Essential (primary) hypertension: Secondary | ICD-10-CM | POA: Diagnosis not present

## 2016-10-10 MED ORDER — HYDROCHLOROTHIAZIDE 12.5 MG PO CAPS: 12.5000 mg | ORAL_CAPSULE | Freq: Every day | ORAL | 1 refills | Status: DC

## 2016-10-10 MED ORDER — METOPROLOL SUCCINATE ER 25 MG PO TB24: 25.0000 mg | ORAL_TABLET | Freq: Every day | ORAL | 1 refills | Status: DC

## 2016-10-10 NOTE — Progress Notes (Addendum)
Name: Erin [name]   MRN: [id number]    DOB: 1971-02-03   Date:10/10/2016       Progress Note  Subjective  Chief Complaint  Chief Complaint  Patient presents with  . Annual Exam    HPI  Well Woman: she denies vaginal discharge, she states only occasionally when she sneezes she may have a little incontinence - stable and she does not want referral PT. No dyspareunia. No breast lumps, mammogram up to date and normal. Pap smear up to date , cycles regular, getting heavier  HTN: over the past month she resumed taking HCTZ because she noticed bp was going up, over this past weekend she woke up in the middle of the night with palpitation, SOB and substernal pressure, she went to [facility] and by the time she saw the physician she was feeling better. It felt like a panic attack, and by the time she calmed down she felt better. She resumed propanolol, old rx that she had at home. BP is at goal today.  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 She does not want to take SSRI, she states beta-blockers seems to help. We will try Metoprolol XL    Chronic neck pain: she is doing well, not taking baclofen lately, no tingling or numbness.   Family history of melanoma: mother died at age 46 from complications of melanoma. Raised by grandmother until father remarried , after that lived with step-mother and father.Advised to follow up with Dermatologist, she states she will try to follow up    Patient Active Problem List   Diagnosis Date Noted  . Panic attack 10/10/2016  . APC (atrial premature contractions) 05/27/2015  . History of varicose veins 05/27/2015  . Vitamin D deficiency 05/27/2015  . Chronic neck pain 05/27/2015  . Fibromyalgia 05/27/2015  . Tachycardia 02/05/2012  . Essential (primary) hypertension  11/04/2008    Past Surgical History:  Procedure Laterality Date  . CESAREAN SECTION      Family History  Problem Relation Age of Onset  . Heart attack Father 2  . Hypertension Father   . Heart attack Brother 48       s/p stent   . Hyperlipidemia Brother   . Hypertension Brother   . Melanoma Mother     Social History   Social History  . Marital status: Married    Spouse name: N/A  . Number of children: N/A  . Years of education: N/A   Occupational History  . Not on file.   Social History Main Topics  . Smoking status: Never Smoker  . Smokeless tobacco: Never Used  . Alcohol use No  . Drug use: No  . Sexual activity: Not on file   Other Topics Concern  . Not on file   Social History Narrative  . No narrative on file     Current Outpatient Prescriptions:  .  baclofen (LIORESAL) 20 MG tablet, Take 1 tablet (20 mg total) by mouth 3 (three) times daily., Disp: 90 each, Rfl: 0 .  hydrochlorothiazide (MICROZIDE) 12.5 MG capsule, Take 1 capsule (12.5 mg total) by mouth daily., Disp: 90 capsule, Rfl: 1 .  Multiple Vitamin (MULTIVITAMIN) tablet, Take 1 tablet by mouth daily., Disp: , Rfl:  .  metoprolol succinate (TOPROL XL) 25 MG 24 hr tablet, Take 1 tablet (25  mg total) by mouth daily., Disp: 90 tablet, Rfl: 1  No Known Allergies   ROS  Constitutional: Negative for fever or weight change.  Respiratory: Negative for cough and shortness of breath.   Cardiovascular: Negative for chest pain or palpitations.  Gastrointestinal: Negative for abdominal pain, no bowel changes.  Musculoskeletal: Negative for gait problem or joint swelling.  Skin: Negative for rash.  Neurological: Negative for dizziness or headache.  No other specific complaints in a complete review of systems (except as listed in HPI above).  Objective  Vitals:   10/10/16 1106  BP: 132/68  Pulse: 92  Resp: 16  Temp: 98.5 F (36.9 C)  SpO2: 96%  Weight: 141 lb (64 kg)  Height: 5' 3.3" (1.608  m)    Body mass index is 24.74 kg/m.  Physical Exam  Constitutional: Patient appears well-developed and well-nourished. No distress.  HENT: Head: Normocephalic and atraumatic. Ears: B TMs ok, no erythema or effusion; Nose: Nose normal. Mouth/Throat: Oropharynx is clear and moist. No oropharyngeal exudate.  Eyes: Conjunctivae and EOM are normal. Pupils are equal, round, and reactive to light. No scleral icterus.  Neck: Normal range of motion. Neck supple. No JVD present. No thyromegaly present.  Cardiovascular: Normal rate, regular rhythm and normal heart sounds.  No murmur heard. No BLE edema. Pulmonary/Chest: Effort normal and breath sounds normal. No respiratory distress. Abdominal: Soft. Bowel sounds are normal, no distension. There is no tenderness. no masses Breast: no lumps or masses, no nipple discharge or rashes FEMALE GENITALIA:  Not done RECTAL: not done Musculoskeletal: Normal range of motion, no joint effusions. No gross deformities Neurological: he is alert and oriented to person, place, and time. No cranial nerve deficit. Coordination, balance, strength, speech and gait are normal.  Skin: Skin is warm and dry. No rash noted. No erythema.  Psychiatric: Patient has a normal mood and affect. behavior is normal. Judgment and thought content normal.  Recent Results (from the past 2160 hour(s))  Basic metabolic panel     Status: Abnormal   Collection Time: 10/07/16 12:47 AM  Result Value Ref Range   Sodium 137 135 - 145 mmol/L   Potassium 3.5 3.5 - 5.1 mmol/L   Chloride 103 101 - 111 mmol/L   CO2 27 22 - 32 mmol/L   Glucose, Bld 116 (H) 65 - 99 mg/dL   BUN 19 6 - 20 mg/dL   Creatinine, Ser 0.96 0.44 - 1.00 mg/dL   Calcium 9.0 8.9 - 10.3 mg/dL   GFR calc non Af Amer >60 >60 mL/min   GFR calc Af Amer >60 >60 mL/min    Comment: (NOTE) The eGFR has been calculated using the CKD EPI equation. This calculation has not been validated in all clinical situations. eGFR's  persistently <60 mL/min signify possible Chronic Kidney Disease.    Anion gap 7 5 - 15  CBC     Status: None   Collection Time: 10/07/16 12:47 AM  Result Value Ref Range   WBC 8.2 3.6 - 11.0 K/uL   RBC 4.01 3.80 - 5.20 MIL/uL   Hemoglobin 13.6 12.0 - 16.0 g/dL   HCT 38.9 35.0 - 47.0 %   MCV 96.9 80.0 - 100.0 fL   MCH 34.0 26.0 - 34.0 pg   MCHC 35.1 32.0 - 36.0 g/dL   RDW 12.3 11.5 - 14.5 %   Platelets 208 150 - 440 K/uL  Troponin I     Status: None   Collection Time: 10/07/16 12:47 AM  Result  Value Ref Range   Troponin I <0.03 <0.03 ng/mL     PHQ2/9: Depression screen [facility] 2/9 10/10/2016 05/27/2015  Decreased Interest 0 0  Down, Depressed, Hopeless 0 0  PHQ - 2 Score 0 0     Fall Risk: Fall Risk  10/10/2016 05/27/2015  Falls in the past year? No No     Functional Status Survey: Is the patient deaf or have difficulty hearing?: No Does the patient have difficulty seeing, even when wearing glasses/contacts?: No Does the patient have difficulty concentrating, remembering, or making decisions?: No Does the patient have difficulty walking or climbing stairs?: No Does the patient have difficulty dressing or bathing?: No Does the patient have difficulty doing errands alone such as visiting a doctor's office or shopping?: No   Assessment & Plan  1. Well woman exam  Discussed importance of 150 minutes of physical activity weekly, eat two servings of fish weekly, eat one serving of tree nuts ( cashews, pistachios, pecans, almonds.Marland Kitchen) every other day, eat 6 servings of fruit/vegetables daily and drink plenty of water and avoid sweet beverages.   2. Cervical cancer screening  Up to date  3. Breast cancer screening  Up to date  4. Vitamin D deficiency  Encouraged patient to resume otc vitamin D 2000 units daily   5. Essential (primary) hypertension  We will add Toprol XL , and continue HCTZ, monitor bp and return sooner if bp stays above 140/90 - metoprolol succinate  (TOPROL XL) 25 MG 24 hr tablet; Take 1 tablet (25 mg total) by mouth daily.  Dispense: 90 tablet; Refill: 1 - hydrochlorothiazide (MICROZIDE) 12.5 MG capsule; Take 1 capsule (12.5 mg total) by mouth daily.  Dispense: 90 capsule; Refill: 1  6. Panic attack  She refused SSRI, she wants to take beta-blockers for now, she would like to hold off on BZD Discussed mindfulness, ways to rest mind, treat self on weekends  7. Tachycardia  - metoprolol succinate (TOPROL XL) 25 MG 24 hr tablet; Take 1 tablet (25 mg total) by mouth daily.  Dispense: 90 tablet; Refill: 1 - TSH  8. Screening for diabetes mellitus  - Hemoglobin A1c - Insulin, fasting  9. Lipid screening  - Lipid panel

## 2016-10-10 NOTE — Patient Instructions (Signed)
Preventive Care 40-64 Years, Female Preventive care refers to lifestyle choices and visits with your health care provider that can promote health and wellness. What does preventive care include?  A yearly physical exam. This is also called an annual well check.  Dental exams once or twice a year.  Routine eye exams. Ask your health care provider how often you should have your eyes checked.  Personal lifestyle choices, including: ? Daily care of your teeth and gums. ? Regular physical activity. ? Eating a healthy diet. ? Avoiding tobacco and drug use. ? Limiting alcohol use. ? Practicing safe sex. ? Taking low-dose aspirin daily starting at age 58. ? Taking vitamin and mineral supplements as recommended by your health care provider. What happens during an annual well check? The services and screenings done by your health care provider during your annual well check will depend on your age, overall health, lifestyle risk factors, and family history of disease. Counseling Your health care provider may ask you questions about your:  Alcohol use.  Tobacco use.  Drug use.  Emotional well-being.  Home and relationship well-being.  Sexual activity.  Eating habits.  Work and work Statistician.  Method of birth control.  Menstrual cycle.  Pregnancy history.  Screening You may have the following tests or measurements:  Height, weight, and BMI.  Blood pressure.  Lipid and cholesterol levels. These may be checked every 5 years, or more frequently if you are over 81 years old.  Skin check.  Lung cancer screening. You may have this screening every year starting at age 78 if you have a 30-pack-year history of smoking and currently smoke or have quit within the past 15 years.  Fecal occult blood test (FOBT) of the stool. You may have this test every year starting at age 65.  Flexible sigmoidoscopy or colonoscopy. You may have a sigmoidoscopy every 5 years or a colonoscopy  every 10 years starting at age 30.  Hepatitis C blood test.  Hepatitis B blood test.  Sexually transmitted disease (STD) testing.  Diabetes screening. This is done by checking your blood sugar (glucose) after you have not eaten for a while (fasting). You may have this done every 1-3 years.  Mammogram. This may be done every 1-2 years. Talk to your health care provider about when you should start having regular mammograms. This may depend on whether you have a family history of breast cancer.  BRCA-related cancer screening. This may be done if you have a family history of breast, ovarian, tubal, or peritoneal cancers.  Pelvic exam and Pap test. This may be done every 3 years starting at age 80. Starting at age 36, this may be done every 5 years if you have a Pap test in combination with an HPV test.  Bone density scan. This is done to screen for osteoporosis. You may have this scan if you are at high risk for osteoporosis.  Discuss your test results, treatment options, and if necessary, the need for more tests with your health care provider. Vaccines Your health care provider may recommend certain vaccines, such as:  Influenza vaccine. This is recommended every year.  Tetanus, diphtheria, and acellular pertussis (Tdap, Td) vaccine. You may need a Td booster every 10 years.  Varicella vaccine. You may need this if you have not been vaccinated.  Zoster vaccine. You may need this after age 5.  Measles, mumps, and rubella (MMR) vaccine. You may need at least one dose of MMR if you were born in  1957 or later. You may also need a second dose.  Pneumococcal 13-valent conjugate (PCV13) vaccine. You may need this if you have certain conditions and were not previously vaccinated.  Pneumococcal polysaccharide (PPSV23) vaccine. You may need one or two doses if you smoke cigarettes or if you have certain conditions.  Meningococcal vaccine. You may need this if you have certain  conditions.  Hepatitis A vaccine. You may need this if you have certain conditions or if you travel or work in places where you may be exposed to hepatitis A.  Hepatitis B vaccine. You may need this if you have certain conditions or if you travel or work in places where you may be exposed to hepatitis B.  Haemophilus influenzae type b (Hib) vaccine. You may need this if you have certain conditions.  Talk to your health care provider about which screenings and vaccines you need and how often you need them. This information is not intended to replace advice given to you by your health care provider. Make sure you discuss any questions you have with your health care provider. Document Released: 03/25/2015 Document Revised: 11/16/2015 Document Reviewed: 12/28/2014 Elsevier Interactive Patient Education  2017 [employer].

## 2017-01-09 ENCOUNTER — Ambulatory Visit: Payer: BLUE CROSS/BLUE SHIELD | Admitting: Family Medicine

## 2017-01-23 ENCOUNTER — Ambulatory Visit: Payer: BLUE CROSS/BLUE SHIELD | Admitting: Family Medicine

## 2017-05-30 ENCOUNTER — Encounter: Payer: Self-pay | Admitting: Family Medicine

## 2017-05-30 ENCOUNTER — Ambulatory Visit: Payer: BLUE CROSS/BLUE SHIELD | Admitting: Family Medicine

## 2017-05-30 VITALS — BP 132/84 | HR 77 | Temp 98.5°F | Resp 14 | Ht 63.25 in | Wt 143.6 lb

## 2017-05-30 DIAGNOSIS — Z131 Encounter for screening for diabetes mellitus: Secondary | ICD-10-CM | POA: Diagnosis not present

## 2017-05-30 DIAGNOSIS — Z13220 Encounter for screening for lipoid disorders: Secondary | ICD-10-CM

## 2017-05-30 DIAGNOSIS — I491 Atrial premature depolarization: Secondary | ICD-10-CM | POA: Diagnosis not present

## 2017-05-30 DIAGNOSIS — R5383 Other fatigue: Secondary | ICD-10-CM | POA: Diagnosis not present

## 2017-05-30 DIAGNOSIS — F410 Panic disorder [episodic paroxysmal anxiety] without agoraphobia: Secondary | ICD-10-CM

## 2017-05-30 DIAGNOSIS — E559 Vitamin D deficiency, unspecified: Secondary | ICD-10-CM

## 2017-05-30 DIAGNOSIS — R000 Tachycardia, unspecified: Secondary | ICD-10-CM

## 2017-05-30 DIAGNOSIS — I10 Essential (primary) hypertension: Secondary | ICD-10-CM

## 2017-05-30 DIAGNOSIS — Z808 Family history of malignant neoplasm of other organs or systems: Secondary | ICD-10-CM

## 2017-05-30 MED ORDER — METOPROLOL SUCCINATE ER 50 MG PO TB24: 50.0000 mg | ORAL_TABLET | Freq: Every day | ORAL | 1 refills | Status: DC

## 2017-05-30 MED ORDER — HYDROCHLOROTHIAZIDE 12.5 MG PO CAPS: 12.5000 mg | ORAL_CAPSULE | Freq: Every day | ORAL | 1 refills | Status: DC

## 2017-05-30 NOTE — Progress Notes (Signed)
Name: Erin [name]   MRN: [id number]    DOB: 1971-01-06   Date:49/21/2019       Progress Note  Subjective  Chief Complaint  Chief Complaint  Patient presents with  . Hypertension    HPI  HTN: she is doing well on HCTZ and metoprolol, likes the fact that relaxes her at night and helps with her heart rate.  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 She does not want medication, feels like metoprolol has been helping   Chronic neck pain: she is doing well at this time.   Family history of melanoma: mother died at age 19 from complications of melanoma. Raised by grandmother until father remarried , after that lived with step-mother and father.Advised to follow up with Dermatologist, she still has not made an appointment as requested.   APC: doing well on metoprolol   Other fatigue: she has been feeling tired, goes home and wants to go to bed.    Patient Active Problem List   Diagnosis Date Noted  . Panic attack 10/10/2016  . APC (atrial premature contractions) 05/27/2015  . History of varicose veins 05/27/2015  . Vitamin D deficiency 05/27/2015  . Chronic neck pain 05/27/2015  . Fibromyalgia 05/27/2015  . Tachycardia 02/05/2012  . Essential (primary) hypertension 11/04/2008    Past Surgical History:  Procedure Laterality Date  . CESAREAN SECTION      Family History  Problem Relation Age of Onset  . Heart attack Father 72  . Hypertension Father   . Heart attack Brother 40       s/p stent   . Hyperlipidemia Brother   . Hypertension Brother   . Melanoma Mother     Social History   Socioeconomic History  . Marital status: Married    Spouse name: Not on file  . Number of children: Not on file  . Years of education: Not on file  . Highest education level: Not on file  Occupational History  . Not on file  Social Needs   . Financial resource strain: Not on file  . Food insecurity:    Worry: Not on file    Inability: Not on file  . Transportation needs:    Medical: Not on file    Non-medical: Not on file  Tobacco Use  . Smoking status: Never Smoker  . Smokeless tobacco: Never Used  Substance and Sexual Activity  . Alcohol use: No    Alcohol/week: 0.0 oz  . Drug use: No  . Sexual activity: Yes    Partners: Male  Lifestyle  . Physical activity:    Days per week: Not on file    Minutes per session: Not on file  . Stress: Not on file  Relationships  . Social connections:    Talks on phone: Not on file    Gets together: Not on file    Attends religious service: Not on file    Active member of club or organization: Not on file    Attends meetings of clubs or organizations: Not on file    Relationship status: Not on file  . Intimate partner violence:    Fear of current or ex partner: Not on file    Emotionally abused: Not on file    Physically abused: Not on file    Forced sexual activity: Not on file  Other Topics Concern  . Not on file  Social History Narrative  . Not on file     Current Outpatient Medications:  .  hydrochlorothiazide (MICROZIDE) 12.5 MG capsule, Take 1 capsule (12.5 mg total) by mouth daily., Disp: 90 capsule, Rfl: 1 .  metoprolol succinate (TOPROL-XL) 50 MG 24 hr tablet, Take 1 tablet (50 mg total) by mouth daily., Disp: 90 tablet, Rfl: 1 .  baclofen (LIORESAL) 20 MG tablet, Take 1 tablet (20 mg total) by mouth 3 (three) times daily. (Patient not taking: Reported on 05/30/2017), Disp: 90 each, Rfl: 0 .  Multiple Vitamin (MULTIVITAMIN) tablet, Take 1 tablet by mouth daily., Disp: , Rfl:   No Known Allergies   ROS  Constitutional: Negative for fever or weight change.  Respiratory: Negative for cough and shortness of breath.   Cardiovascular: Negative for chest pain or palpitations.  Gastrointestinal: Negative for abdominal pain, no bowel changes.  Musculoskeletal:  Negative for gait problem or joint swelling.  Skin: Negative for rash.  Neurological: Negative for dizziness or headache.  No other specific complaints in a complete review of systems (except as listed in HPI above).   Objective  Vitals:   05/30/17 0900  BP: 132/84  Pulse: 77  Resp: 14  Temp: 98.5 F (36.9 C)  TempSrc: Oral  SpO2: 97%  Weight: 143 lb 9.6 oz (65.1 kg)  Height: 5' 3.25" (1.607 m)    Body mass index is 25.24 kg/m.  Physical Exam  Constitutional: Patient appears well-developed and well-nourished.  No distress.  HEENT: head atraumatic, normocephalic, pupils equal and reactive to light,  neck supple, throat within normal limits Cardiovascular: Normal rate, regular rhythm and normal heart sounds.  No murmur heard. No BLE edema. Pulmonary/Chest: Effort normal and breath sounds normal. No respiratory distress. Abdominal: Soft.  There is no tenderness. Psychiatric: Patient has a normal mood and affect. behavior is normal. Judgment and thought content normal.  PHQ2/9: Depression screen [facility] 2/9 05/30/2017 10/10/2016 05/27/2015  Decreased Interest 0 0 0  Down, Depressed, Hopeless 0 0 0  PHQ - 2 Score 0 0 0    Fall Risk: Fall Risk  05/30/2017 10/10/2016 05/27/2015  Falls in the past year? No No No    Functional Status Survey: Is the patient deaf or have difficulty hearing?: No Does the patient have difficulty seeing, even when wearing glasses/contacts?: No Does the patient have difficulty concentrating, remembering, or making decisions?: No Does the patient have difficulty walking or climbing stairs?: No Does the patient have difficulty dressing or bathing?: No Does the patient have difficulty doing errands alone such as visiting a doctor's office or shopping?: No    Assessment & Plan  1. Essential (primary) hypertension  bP is good, however she states likes taking metoprolol because it slows down her heart rate helps her sleep, but feels like it would be better  to get a higher dose to help during the day, HR is 77, we will try going up to 50 - metoprolol succinate (TOPROL-XL) 50 MG 24 hr tablet; Take 1 tablet (50 mg total) by mouth daily.  Dispense: 90 tablet; Refill: 1 - hydrochlorothiazide (MICROZIDE) 12.5 MG capsule; Take 1 capsule (12.5 mg total) by mouth daily.  Dispense: 90 capsule; Refill: 1  2. Panic attack  Doing better now, states metoprolol improves with anxiety and sleep  3. Vitamin D deficiency  Recheck level   4. Tachycardia  - metoprolol succinate (TOPROL-XL) 50 MG 24 hr tablet; Take 1 tablet (50 mg total)  by mouth daily.  Dispense: 90 tablet; Refill: 1  5. APC (atrial premature contractions)   6. Other fatigue  - COMPLETE METABOLIC PANEL WITH GFR - CBC with Differential/Platelet - VITAMIN D 25 Hydroxy (Vit-D Deficiency, Fractures) - TSH - Vitamin B12  7. Lipid screening  - Lipid panel  8. Screening for diabetes mellitus  - Hemoglobin A1c  9. Family history of melanoma  - Ambulatory referral to Dermatology

## 2017-05-31 ENCOUNTER — Telehealth: Payer: Self-pay

## 2017-05-31 LAB — COMPLETE METABOLIC PANEL WITH GFR
AG Ratio: 1.7 (calc) (ref 1.0–2.5)
ALKALINE PHOSPHATASE (APISO): 53 U/L (ref 33–115)
ALT: 15 U/L (ref 6–29)
AST: 19 U/L (ref 10–35)
Albumin: 4.5 g/dL (ref 3.6–5.1)
BILIRUBIN TOTAL: 1 mg/dL (ref 0.2–1.2)
BUN: 14 mg/dL (ref 7–25)
CHLORIDE: 103 mmol/L (ref 98–110)
CO2: 30 mmol/L (ref 20–32)
Calcium: 9.1 mg/dL (ref 8.6–10.2)
Creat: 0.91 mg/dL (ref 0.50–1.10)
GFR, Est African American: 86 mL/min/{1.73_m2} (ref >=60)
GFR, Est Non African American: 75 mL/min/{1.73_m2} (ref >=60)
GLOBULIN: 2.7 g/dL (ref 1.9–3.7)
Glucose, Bld: 87 mg/dL (ref 65–99)
POTASSIUM: 4.2 mmol/L (ref 3.5–5.3)
SODIUM: 138 mmol/L (ref 135–146)
Total Protein: 7.2 g/dL (ref 6.1–8.1)

## 2017-05-31 LAB — LIPID PANEL
CHOL/HDL RATIO: 3.5 (calc) (ref <5.0)
CHOLESTEROL: 172 mg/dL (ref <200)
HDL: 49 mg/dL — AB (ref >50)
LDL CHOLESTEROL (CALC): 109 mg/dL — AB
Non-HDL Cholesterol (Calc): 123 mg/dL (calc) (ref <130)
Triglycerides: 59 mg/dL (ref <150)

## 2017-05-31 LAB — CBC WITH DIFFERENTIAL/PLATELET
BASOS PCT: 1.2 %
Basophils Absolute: 50 cells/uL (ref 0–200)
Eosinophils Absolute: 143 cells/uL (ref 15–500)
Eosinophils Relative: 3.4 %
HCT: 39.7 % (ref 35.0–45.0)
Hemoglobin: 14 g/dL (ref 11.7–15.5)
Lymphs Abs: 1147 cells/uL (ref 850–3900)
MCH: 33.5 pg — ABNORMAL HIGH (ref 27.0–33.0)
MCHC: 35.3 g/dL (ref 32.0–36.0)
MCV: 95 fL (ref 80.0–100.0)
MONOS PCT: 14.1 %
MPV: 10.9 fL (ref 7.5–12.5)
Neutro Abs: 2268 cells/uL (ref 1500–7800)
Neutrophils Relative %: 54 %
PLATELETS: 245 10*3/uL (ref 140–400)
RBC: 4.18 10*6/uL (ref 3.80–5.10)
RDW: 12 % (ref 11.0–15.0)
TOTAL LYMPHOCYTE: 27.3 %
WBC mixed population: 592 cells/uL (ref 200–950)
WBC: 4.2 10*3/uL (ref 3.8–10.8)

## 2017-05-31 LAB — VITAMIN D 25 HYDROXY (VIT D DEFICIENCY, FRACTURES): Vit D, 25-Hydroxy: 31 ng/mL (ref 30–100)

## 2017-05-31 LAB — TSH: TSH: 1.24 m[IU]/L

## 2017-05-31 LAB — HEMOGLOBIN A1C
EAG (MMOL/L): 5.4 (calc)
HEMOGLOBIN A1C: 5 %{Hb} (ref <5.7)
MEAN PLASMA GLUCOSE: 97 (calc)

## 2017-05-31 LAB — VITAMIN B12: Vitamin B-12: 491 pg/mL (ref 200–1100)

## 2017-05-31 NOTE — Telephone Encounter (Signed)
I tried to contact this patient to inform her that she has been scheduled to see Dr. Brendolyn Patty on 07/09/17 at 2:45pm but there was no answer. A message was left on her machine with this information and their number [phone]) in case she need to reschedule.

## 2017-05-31 NOTE — Telephone Encounter (Signed)
-----   Message from Vonna Kotyk, [place] sent at 05/31/2017 10:04 AM EDT ----- Sorry, do you mind calling patient and giving her the referral information.   ----- Message ----- From: Dennard Schaumann, CMA Sent: 05/31/2017   9:04 AM To: Vonna Kotyk, CMA  Please let this patient know that she has been scheduled to see Dr. Brendolyn Patty with [facility] on 07/09/17 at 2:45pm.  Their number is [phone].

## 2017-10-18 ENCOUNTER — Other Ambulatory Visit: Payer: Self-pay | Admitting: Family Medicine

## 2017-10-18 MED ORDER — METOPROLOL TARTRATE 25 MG PO TABS: 25.0000 mg | ORAL_TABLET | Freq: Two times a day (BID) | ORAL | 0 refills | Status: DC

## 2017-10-18 NOTE — Telephone Encounter (Signed)
Copied from [place] [id number]. Topic: Quick Communication - See Telephone Encounter >> Oct 18, 2017 10:37 AM Ahmed Prima L wrote: CRM for notification. See Telephone encounter for: 10/18/17.  Patient states she was in on 3/21 and her and Dr Ancil Boozer discussed her metoprolol succinate (TOPROL-XL) 50 MG 24 hr tablet. She said that Dr Ancil Boozer told her if she wanted to decrease it, then to just let her know and she would change the script. She has been cutting them in half since the last couple months and the lower dosage works better for her. Please advise. She also said that she could try the combination pill if that is what Dr Ancil Boozer prefers.   [facility].

## 2017-10-18 NOTE — Telephone Encounter (Signed)
Please adjust dosage and submit new RX

## 2017-10-21 ENCOUNTER — Other Ambulatory Visit: Payer: Self-pay | Admitting: Family Medicine

## 2017-10-21 DIAGNOSIS — I10 Essential (primary) hypertension: Secondary | ICD-10-CM

## 2017-10-21 DIAGNOSIS — R000 Tachycardia, unspecified: Secondary | ICD-10-CM

## 2017-10-21 MED ORDER — METOPROLOL SUCCINATE ER 25 MG PO TB24: 25.0000 mg | ORAL_TABLET | Freq: Every day | ORAL | 0 refills | Status: DC

## 2017-11-13 DIAGNOSIS — H5213 Myopia, bilateral: Secondary | ICD-10-CM | POA: Diagnosis not present

## 2017-11-22 ENCOUNTER — Encounter: Payer: Self-pay | Admitting: Family Medicine

## 2017-11-22 ENCOUNTER — Ambulatory Visit (INDEPENDENT_AMBULATORY_CARE_PROVIDER_SITE_OTHER): Payer: BLUE CROSS/BLUE SHIELD | Admitting: Family Medicine

## 2017-11-22 VITALS — BP 122/86 | HR 71 | Temp 98.7°F | Resp 16 | Ht 63.0 in | Wt 145.0 lb

## 2017-11-22 DIAGNOSIS — E559 Vitamin D deficiency, unspecified: Secondary | ICD-10-CM

## 2017-11-22 DIAGNOSIS — Z23 Encounter for immunization: Secondary | ICD-10-CM

## 2017-11-22 DIAGNOSIS — Z01419 Encounter for gynecological examination (general) (routine) without abnormal findings: Secondary | ICD-10-CM | POA: Diagnosis not present

## 2017-11-22 DIAGNOSIS — Z1231 Encounter for screening mammogram for malignant neoplasm of breast: Secondary | ICD-10-CM | POA: Diagnosis not present

## 2017-11-22 DIAGNOSIS — I10 Essential (primary) hypertension: Secondary | ICD-10-CM

## 2017-11-22 DIAGNOSIS — R000 Tachycardia, unspecified: Secondary | ICD-10-CM | POA: Diagnosis not present

## 2017-11-22 DIAGNOSIS — B029 Zoster without complications: Secondary | ICD-10-CM

## 2017-11-22 DIAGNOSIS — Z1239 Encounter for other screening for malignant neoplasm of breast: Secondary | ICD-10-CM

## 2017-11-22 DIAGNOSIS — I491 Atrial premature depolarization: Secondary | ICD-10-CM

## 2017-11-22 DIAGNOSIS — G4709 Other insomnia: Secondary | ICD-10-CM | POA: Diagnosis not present

## 2017-11-22 MED ORDER — TRAZODONE HCL 50 MG PO TABS: 25.0000 mg | ORAL_TABLET | Freq: Every evening | ORAL | 0 refills | Status: DC

## 2017-11-22 MED ORDER — VALACYCLOVIR HCL 1 G PO TABS: 1000.0000 mg | ORAL_TABLET | Freq: Three times a day (TID) | ORAL | 0 refills | Status: DC

## 2017-11-22 MED ORDER — HYDROCHLOROTHIAZIDE 12.5 MG PO CAPS: 12.5000 mg | ORAL_CAPSULE | Freq: Every day | ORAL | 1 refills | Status: DC

## 2017-11-22 MED ORDER — METOPROLOL SUCCINATE ER 25 MG PO TB24: 25.0000 mg | ORAL_TABLET | Freq: Every day | ORAL | 1 refills | Status: DC

## 2017-11-22 NOTE — Patient Instructions (Signed)
Preventive Care 40-64 Years, Female Preventive care refers to lifestyle choices and visits with your health care provider that can promote health and wellness. What does preventive care include?  A yearly physical exam. This is also called an annual well check.  Dental exams once or twice a year.  Routine eye exams. Ask your health care provider how often you should have your eyes checked.  Personal lifestyle choices, including: ? Daily care of your teeth and gums. ? Regular physical activity. ? Eating a healthy diet. ? Avoiding tobacco and drug use. ? Limiting alcohol use. ? Practicing safe sex. ? Taking low-dose aspirin daily starting at age 58. ? Taking vitamin and mineral supplements as recommended by your health care provider. What happens during an annual well check? The services and screenings done by your health care provider during your annual well check will depend on your age, overall health, lifestyle risk factors, and family history of disease. Counseling Your health care provider may ask you questions about your:  Alcohol use.  Tobacco use.  Drug use.  Emotional well-being.  Home and relationship well-being.  Sexual activity.  Eating habits.  Work and work Statistician.  Method of birth control.  Menstrual cycle.  Pregnancy history.  Screening You may have the following tests or measurements:  Height, weight, and BMI.  Blood pressure.  Lipid and cholesterol levels. These may be checked every 5 years, or more frequently if you are over 81 years old.  Skin check.  Lung cancer screening. You may have this screening every year starting at age 78 if you have a 30-pack-year history of smoking and currently smoke or have quit within the past 15 years.  Fecal occult blood test (FOBT) of the stool. You may have this test every year starting at age 65.  Flexible sigmoidoscopy or colonoscopy. You may have a sigmoidoscopy every 5 years or a colonoscopy  every 10 years starting at age 30.  Hepatitis C blood test.  Hepatitis B blood test.  Sexually transmitted disease (STD) testing.  Diabetes screening. This is done by checking your blood sugar (glucose) after you have not eaten for a while (fasting). You may have this done every 1-3 years.  Mammogram. This may be done every 1-2 years. Talk to your health care provider about when you should start having regular mammograms. This may depend on whether you have a family history of breast cancer.  BRCA-related cancer screening. This may be done if you have a family history of breast, ovarian, tubal, or peritoneal cancers.  Pelvic exam and Pap test. This may be done every 3 years starting at age 80. Starting at age 36, this may be done every 5 years if you have a Pap test in combination with an HPV test.  Bone density scan. This is done to screen for osteoporosis. You may have this scan if you are at high risk for osteoporosis.  Discuss your test results, treatment options, and if necessary, the need for more tests with your health care provider. Vaccines Your health care provider may recommend certain vaccines, such as:  Influenza vaccine. This is recommended every year.  Tetanus, diphtheria, and acellular pertussis (Tdap, Td) vaccine. You may need a Td booster every 10 years.  Varicella vaccine. You may need this if you have not been vaccinated.  Zoster vaccine. You may need this after age 5.  Measles, mumps, and rubella (MMR) vaccine. You may need at least one dose of MMR if you were born in  1959 or later. You may also need a second dose.  Pneumococcal 13-valent conjugate (PCV13) vaccine. You may need this if you have certain conditions and were not previously vaccinated.  Pneumococcal polysaccharide (PPSV23) vaccine. You may need one or two doses if you smoke cigarettes or if you have certain conditions.  Meningococcal vaccine. You may need this if you have certain  conditions.  Hepatitis A vaccine. You may need this if you have certain conditions or if you travel or work in places where you may be exposed to hepatitis A.  Hepatitis B vaccine. You may need this if you have certain conditions or if you travel or work in places where you may be exposed to hepatitis B.  Haemophilus influenzae type b (Hib) vaccine. You may need this if you have certain conditions.  Talk to your health care provider about which screenings and vaccines you need and how often you need them. This information is not intended to replace advice given to you by your health care provider. Make sure you discuss any questions you have with your health care provider. Document Released: 03/25/2015 Document Revised: 11/16/2015 Document Reviewed: 12/28/2014 Elsevier Interactive Patient Education  2018 Elsevier Inc.  

## 2017-11-22 NOTE — Progress Notes (Signed)
Name: Erin [name]   MRN: [id number]    DOB: [birth year]   Date:11/22/2017       Progress Note  Subjective  Chief Complaint  Chief Complaint  Patient presents with  . Annual Exam  . Medication Refill  . Hypertension  . GAD  . Chronic Neck Pain    HPI   Patient presents for annual CPE and follow up  HTN: she is doing well on HCTZ and metoprolol, likes the fact that relaxes her at night and helps with her heart rate. 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 Not on medication at this time. She usually tries to distract her mind and takes deep breaths  Chronic neck pain:doing much better, symptoms resolved   Family history of melanoma: mother died at age 5 from complications of melanoma. Raised by grandmother until father remarried , after that lived with step-mother and father.Advised to follow up with Dermatologist, she still has not made an appointment as requested. Advised her again  Insomnia: she states 4/7 days a week she has difficulty falling or staying asleep and feels tired during the day, discussed medication and we will try trazodone  Shingles: she states noticed a rash under left breast about one week ago, and pain on left side of her back, burning like. She is also noticed more fatigue.   APC: doing well on metoprolol    USPSTF grade A and B recommendations    Office Visit from 11/22/2017 in [facility]  AUDIT-C Score  0     Depression:  Depression screen [facility] 2/9 11/22/2017 05/30/2017 10/10/2016 05/27/2015  Decreased Interest 0 0 0 0  Down, Depressed, Hopeless 0 0 0 0  PHQ - 2 Score 0 0 0 0  Altered sleeping 2 - - -  Tired, decreased energy 1 - - -  Change in appetite 0 - - -  Feeling bad or failure about yourself  0 - - -  Trouble concentrating 0 - - -  Moving slowly or fidgety/restless 0 - - -  Suicidal thoughts 0 - - -  PHQ-9 Score 3  - - -  Difficult doing work/chores Not difficult at all - - -   Hypertension: BP Readings from Last 3 Encounters:  11/22/17 122/86  05/30/17 132/84  10/10/16 132/68   Obesity: Wt Readings from Last 3 Encounters:  11/22/17 145 lb (65.8 kg)  05/30/17 143 lb 9.6 oz (65.1 kg)  10/10/16 141 lb (64 kg)   BMI Readings from Last 3 Encounters:  11/22/17 25.69 kg/m  05/30/17 25.24 kg/m  10/10/16 24.74 kg/m     STD testing and prevention (HIV/chl/gon/syphilis): N/A Intimate partner violence: negative screen  Sexual History/Pain during Intercourse: no pain  Menstrual History/LMP/Abnormal Bleeding: discussed menopausal symptoms, cycles still regular  Incontinence Symptoms: occasional stress incontinence  Advanced Care Planning: A voluntary discussion about advance care planning including the explanation and discussion of advance directives.  Discussed health care proxy and Living will, and the patient was able to identify a health care proxy as husband .  Patient does not have a living will at present time.  Breast cancer: due for repeat  BRCA gene screening: N/A Cervical cancer screening: due in 2020  Lipids:  Lab Results  Component Value Date   CHOL 172 05/30/2017   Lab Results  Component Value Date   HDL 49 (  L) 05/30/2017   Lab Results  Component Value Date   LDLCALC 109 (H) 05/30/2017   Lab Results  Component Value Date   TRIG 59 05/30/2017   Lab Results  Component Value Date   CHOLHDL 3.5 05/30/2017   No results found for: LDLDIRECT  Glucose:  Glucose  Date Value Ref Range Status  02/02/2012 90 65 - 99 mg/dL Final   Glucose, Bld  Date Value Ref Range Status  05/30/2017 87 65 - 99 mg/dL Final    Comment:    .            Fasting reference interval .   10/07/2016 116 (H) 65 - 99 mg/dL Final    Skin cancer: discussed atypical lesions ECG: 2018   Patient Active Problem List   Diagnosis Date Noted  . Panic attack 10/10/2016  . APC (atrial premature  contractions) 05/27/2015  . History of varicose veins 05/27/2015  . Vitamin D deficiency 05/27/2015  . Chronic neck pain 05/27/2015  . Fibromyalgia 05/27/2015  . Tachycardia 02/05/2012  . Essential (primary) hypertension 11/04/2008    Past Surgical History:  Procedure Laterality Date  . CESAREAN SECTION  1993    Family History  Problem Relation Age of Onset  . Heart attack Father 16  . Hypertension Father   . Heart attack Brother 63       s/p stent   . Hyperlipidemia Brother   . Hypertension Brother   . Melanoma Mother     Social History   Socioeconomic History  . Marital status: Married    Spouse name: Not on file  . Number of children: 3  . Years of education: Not on file  . Highest education level: Associate degree: occupational, Hotel manager, or vocational program  Occupational History  . Not on file  Social Needs  . Financial resource strain: Not hard at all  . Food insecurity:    Worry: Never true    Inability: Never true  . Transportation needs:    Medical: No    Non-medical: No  Tobacco Use  . Smoking status: Never Smoker  . Smokeless tobacco: Never Used  Substance and Sexual Activity  . Alcohol use: No    Alcohol/week: 0.0 standard drinks  . Drug use: No  . Sexual activity: Yes    Partners: Male  Lifestyle  . Physical activity:    Days per week: 0 days    Minutes per session: 0 min  . Stress: Only a little  Relationships  . Social connections:    Talks on phone: More than three times a week    Gets together: Twice a week    Attends religious service: More than 4 times per year    Active member of club or organization: No    Attends meetings of clubs or organizations: Never    Relationship status: Married  . Intimate partner violence:    Fear of current or ex partner: No    Emotionally abused: No    Physically abused: No    Forced sexual activity: No  Other Topics Concern  . Not on file  Social History Narrative  . Not on file      Current Outpatient Medications:  .  hydrochlorothiazide (MICROZIDE) 12.5 MG capsule, Take 1 capsule (12.5 mg total) by mouth daily., Disp: 90 capsule, Rfl: 1 .  metoprolol succinate (TOPROL-XL) 25 MG 24 hr tablet, Take 1 tablet (25 mg total) by mouth daily., Disp: 30 tablet, Rfl: 0 .  Multiple Vitamin (  MULTIVITAMIN) tablet, Take 1 tablet by mouth daily., Disp: , Rfl:  .  baclofen (LIORESAL) 20 MG tablet, Take 1 tablet (20 mg total) by mouth 3 (three) times daily. (Patient not taking: Reported on 05/30/2017), Disp: 90 each, Rfl: 0  No Known Allergies   ROS  Constitutional: Negative for fever or weight change.  Respiratory: Negative for cough and shortness of breath.   Cardiovascular: Negative for chest pain or palpitations.  Gastrointestinal: Negative for abdominal pain, no bowel changes.  Musculoskeletal: Negative for gait problem or joint swelling.  Skin: Positive  for rash.  Neurological: Negative for dizziness or headache.  No other specific complaints in a complete review of systems (except as listed in HPI above).  Objective  Vitals:   11/22/17 0855  BP: 122/86  Pulse: 71  Resp: 16  Temp: 98.7 F (37.1 C)  TempSrc: Oral  SpO2: 99%  Weight: 145 lb (65.8 kg)  Height: _0  (1.6 m)    Body mass index is 25.69 kg/m.  Physical Exam  Constitutional: Patient appears well-developed and well-nourished. No distress.  HENT: Head: Normocephalic and atraumatic. Ears: B TMs ok, no erythema or effusion; Nose: Nose normal. Mouth/Throat: Oropharynx is clear and moist. No oropharyngeal exudate.  Eyes: Conjunctivae and EOM are normal. Pupils are equal, round, and reactive to light. No scleral icterus.  Neck: Normal range of motion. Neck supple. No JVD present. No thyromegaly present.  Cardiovascular: Normal rate, regular rhythm and normal heart sounds.  No murmur heard. No BLE edema. Pulmonary/Chest: Effort normal and breath sounds normal. No respiratory distress. Abdominal:  Soft. Bowel sounds are normal, no distension. There is no tenderness. no masses Breast: no lumps or masses, no nipple discharge or rashes FEMALE GENITALIA:  Not done RECTAL: not done Musculoskeletal: Normal range of motion, no joint effusions. No gross deformities. Pain/sensitivity on left mid back  Neurological: he is alert and oriented to person, place, and time. No cranial nerve deficit. Coordination, balance, strength, speech and gait are normal.  Skin: Skin is warm and dry. She has an erythematous papular rash under left breast with only one vesicle.  Psychiatric: Patient has a normal mood and affect. behavior is normal. Judgment and thought content normal.  PHQ2/9: Depression screen [facility] 2/9 11/22/2017 05/30/2017 10/10/2016 05/27/2015  Decreased Interest 0 0 0 0  Down, Depressed, Hopeless 0 0 0 0  PHQ - 2 Score 0 0 0 0  Altered sleeping 2 - - -  Tired, decreased energy 1 - - -  Change in appetite 0 - - -  Feeling bad or failure about yourself  0 - - -  Trouble concentrating 0 - - -  Moving slowly or fidgety/restless 0 - - -  Suicidal thoughts 0 - - -  PHQ-9 Score 3 - - -  Difficult doing work/chores Not difficult at all - - -     Fall Risk: Fall Risk  11/22/2017 05/30/2017 10/10/2016 05/27/2015  Falls in the past year? No No No No     Functional Status Survey: Is the patient deaf or have difficulty hearing?: No Does the patient have difficulty seeing, even when wearing glasses/contacts?: Yes(glasses) Does the patient have difficulty concentrating, remembering, or making decisions?: No Does the patient have difficulty walking or climbing stairs?: No Does the patient have difficulty dressing or bathing?: No Does the patient have difficulty doing errands alone such as visiting a doctor's office or shopping?: No   Assessment & Plan  1. Well woman exam   2.  Need for immunization against influenza  - Flu Vaccine QUAD 6+ mos PF IM (Fluarix Quad PF)  3. Essential (primary)  hypertension  - metoprolol succinate (TOPROL-XL) 25 MG 24 hr tablet; Take 1 tablet (25 mg total) by mouth daily.  Dispense: 90 tablet; Refill: 1 - hydrochlorothiazide (MICROZIDE) 12.5 MG capsule; Take 1 capsule (12.5 mg total) by mouth daily.  Dispense: 90 capsule; Refill: 1  4. Tachycardia  - metoprolol succinate (TOPROL-XL) 25 MG 24 hr tablet; Take 1 tablet (25 mg total) by mouth daily.  Dispense: 90 tablet; Refill: 1  5. Vitamin D deficiency  Advised to take otc vitamin D 2000 units daliy   6. APC (atrial premature contractions)   7. Breast cancer screening  - MM 3D SCREEN BREAST BILATERAL  8. Other insomnia  - traZODone (DESYREL) 50 MG tablet; Take 0.5-1 tablets (25-50 mg total) by mouth every evening.  Dispense: 30 tablet; Refill: 0   9. Herpes zoster without complication  - valACYclovir (VALTREX) 1000 MG tablet; Take 1 tablet (1,000 mg total) by mouth 3 (three) times daily.  Dispense: 30 tablet; Refill: 0   -USPSTF grade A and B recommendations reviewed with patient; age-appropriate recommendations, preventive care, screening tests, etc discussed and encouraged; healthy living encouraged; see AVS for patient education given to patient -Discussed importance of 150 minutes of physical activity weekly, eat two servings of fish weekly, eat one serving of tree nuts ( cashews, pistachios, pecans, almonds.Marland Kitchen) every other day, eat 6 servings of fruit/vegetables daily and drink plenty of water and avoid sweet beverages.

## 2017-12-15 ENCOUNTER — Other Ambulatory Visit: Payer: Self-pay | Admitting: Family Medicine

## 2017-12-15 DIAGNOSIS — G4709 Other insomnia: Secondary | ICD-10-CM

## 2017-12-15 NOTE — Telephone Encounter (Signed)
Does she like Trazodone ( we just started) is the dose working well for her? Would she like 90 day supply?

## 2017-12-16 NOTE — Telephone Encounter (Signed)
Left a message for patient to call us back regarding her medication request.

## 2017-12-16 NOTE — Telephone Encounter (Signed)
She does not need a 90 day supply because she does not use it every night. She cuts it in half. She said that is working for her and just takes it as needed. No refill needed at this time.

## 2018-01-15 DIAGNOSIS — F419 Anxiety disorder, unspecified: Secondary | ICD-10-CM | POA: Diagnosis not present

## 2018-02-18 ENCOUNTER — Encounter: Payer: Self-pay | Admitting: Family Medicine

## 2018-02-18 ENCOUNTER — Ambulatory Visit (INDEPENDENT_AMBULATORY_CARE_PROVIDER_SITE_OTHER): Payer: BLUE CROSS/BLUE SHIELD | Admitting: Family Medicine

## 2018-02-18 VITALS — BP 130/84 | HR 87 | Temp 97.9°F | Resp 16 | Ht 63.0 in | Wt 142.2 lb

## 2018-02-18 DIAGNOSIS — R5383 Other fatigue: Secondary | ICD-10-CM | POA: Diagnosis not present

## 2018-02-18 DIAGNOSIS — Z808 Family history of malignant neoplasm of other organs or systems: Secondary | ICD-10-CM

## 2018-02-18 DIAGNOSIS — I10 Essential (primary) hypertension: Secondary | ICD-10-CM

## 2018-02-18 DIAGNOSIS — Z131 Encounter for screening for diabetes mellitus: Secondary | ICD-10-CM

## 2018-02-18 DIAGNOSIS — E559 Vitamin D deficiency, unspecified: Secondary | ICD-10-CM

## 2018-02-18 DIAGNOSIS — Z13220 Encounter for screening for lipoid disorders: Secondary | ICD-10-CM

## 2018-02-18 DIAGNOSIS — L989 Disorder of the skin and subcutaneous tissue, unspecified: Secondary | ICD-10-CM

## 2018-02-18 DIAGNOSIS — N951 Menopausal and female climacteric states: Secondary | ICD-10-CM

## 2018-02-18 NOTE — Progress Notes (Signed)
Name: Erin [name]   MRN: [id number]    DOB: 05-04-1970   Date:02/18/2018       Progress Note  Subjective  Chief Complaint  Chief Complaint  Patient presents with  . Blood Pressure Check    Has had some elevated diastolic reading as well as headaches  . Referral    wants dermatology referral  . Fatigue    feeling tired over the past couple of months.    HPI   HTN: she states noticed bp fluctuating lately. DBP up to 90's, also has some headaches but no dizziness. She has intermittent palpitation usually related to anxiety . She has been evaluated by cardiologist and had holter in the past   Perimenopause: she is worried about her symptoms of fatigue that seems much worse the week of her cycle. Drained, lack of energy, cycles are varying from heavy to light but still monthly. She denies mood changes, but has noticed hot flashes this past week.   Multiple moles/family history of skin cancer: she was referred in the past , however recently got worried because of a scaly patch on right arm, that is itchy at times.    Patient Active Problem List   Diagnosis Date Noted  . Panic attack 10/10/2016  . APC (atrial premature contractions) 05/27/2015  . History of varicose veins 05/27/2015  . Vitamin D deficiency 05/27/2015  . Chronic neck pain 05/27/2015  . Fibromyalgia 05/27/2015  . Tachycardia 02/05/2012  . Essential (primary) hypertension 11/04/2008    Past Surgical History:  Procedure Laterality Date  . CESAREAN SECTION  1993    Family History  Problem Relation Age of Onset  . Heart attack Father 26  . Hypertension Father   . Heart attack Brother 4       s/p stent   . Hyperlipidemia Brother   . Hypertension Brother   . Melanoma Mother     Social History   Socioeconomic History  . Marital status: Married    Spouse name: Not on file  . Number of children: 3  . Years of education: Not on file  . Highest education level: Associate degree: occupational,  Hotel manager, or vocational program  Occupational History  . Occupation: Education officer, museum  Social Needs  . Financial resource strain: Not hard at all  . Food insecurity:    Worry: Never true    Inability: Never true  . Transportation needs:    Medical: No    Non-medical: No  Tobacco Use  . Smoking status: Never Smoker  . Smokeless tobacco: Never Used  Substance and Sexual Activity  . Alcohol use: No    Alcohol/week: 0.0 standard drinks  . Drug use: No  . Sexual activity: Yes    Partners: Male    Birth control/protection: Surgical  Lifestyle  . Physical activity:    Days per week: 0 days    Minutes per session: 0 min  . Stress: Only a little  Relationships  . Social connections:    Talks on phone: More than three times a week    Gets together: Twice a week    Attends religious service: More than 4 times per year    Active member of club or organization: Yes    Attends meetings of clubs or organizations: More than 4 times per year    Relationship status: Married  . Intimate partner violence:    Fear of current or ex partner: No    Emotionally abused: No    Physically abused:  No    Forced sexual activity: No  Other Topics Concern  . Not on file  Social History Narrative  . Not on file     Current Outpatient Medications:  .  hydrochlorothiazide (MICROZIDE) 12.5 MG capsule, Take 1 capsule (12.5 mg total) by mouth daily., Disp: 90 capsule, Rfl: 1 .  metoprolol succinate (TOPROL-XL) 25 MG 24 hr tablet, Take 1 tablet (25 mg total) by mouth daily., Disp: 90 tablet, Rfl: 1 .  Multiple Vitamin (MULTIVITAMIN) tablet, Take 1 tablet by mouth daily., Disp: , Rfl:  .  valACYclovir (VALTREX) 1000 MG tablet, Take 1 tablet (1,000 mg total) by mouth 3 (three) times daily., Disp: 30 tablet, Rfl: 0 .  traZODone (DESYREL) 50 MG tablet, Take 0.5-1 tablets (25-50 mg total) by mouth every evening. (Patient not taking: Reported on 02/18/2018), Disp: 30 tablet, Rfl: 0  No Known Allergies  I  personally reviewed active problem list, medication list, allergies, family history, social history with the patient/caregiver today.   ROS  Constitutional: Negative for fever or weight change.  Respiratory: Negative for cough and shortness of breath.   Cardiovascular: Negative for chest pain , positive for intermittent palpitations.  Gastrointestinal: Negative for abdominal pain, no bowel changes.  Musculoskeletal: Negative for gait problem or joint swelling.  Skin: Negative for rash. new spot on right arm  Neurological: Negative for dizziness , positive for intermittent  headache.  No other specific complaints in a complete review of systems (except as listed in HPI above).   Objective  Vitals:   02/18/18 1438  BP: 130/84  Pulse: 87  Resp: 16  Temp: 97.9 F (36.6 C)  TempSrc: Oral  SpO2: 99%  Weight: 142 lb 3.2 oz (64.5 kg)  Height: 5\' 3"  (1.6 m)    Body mass index is 25.19 kg/m.  Physical Exam  Constitutional: Patient appears well-developed and well-nourished.  No distress.  HEENT: head atraumatic, normocephalic, pupils equal and reactive to light, neck supple, throat within normal limits Cardiovascular: Normal rate, regular rhythm and normal heart sounds.  No murmur heard. No BLE edema. Pulmonary/Chest: Effort normal and breath sounds normal. No respiratory distress. Abdominal: Soft.  There is no tenderness. Psychiatric: Patient has a normal mood and affect. behavior is normal. Judgment and thought content normal.  PHQ2/9: Depression screen [facility] 2/9 02/18/2018 11/22/2017 05/30/2017 10/10/2016 05/27/2015  Decreased Interest 1 0 0 0 0  Down, Depressed, Hopeless 0 0 0 0 0  PHQ - 2 Score 1 0 0 0 0  Altered sleeping 3 2 - - -  Tired, decreased energy 3 1 - - -  Change in appetite 0 0 - - -  Feeling bad or failure about yourself  0 0 - - -  Trouble concentrating 0 0 - - -  Moving slowly or fidgety/restless 0 0 - - -  Suicidal thoughts 0 0 - - -  PHQ-9 Score 7 3 - - -   Difficult doing work/chores Somewhat difficult Not difficult at all - - -     GAD 7 : Generalized Anxiety Score 11/22/2017 10/10/2016  Nervous, Anxious, on Edge 0 2  Control/stop worrying 0 3  Worry too much - different things 0 2  Trouble relaxing 0 2  Restless 0 1  Easily annoyed or irritable 0 0  Afraid - awful might happen 0 2  Total GAD 7 Score 0 12  Anxiety Difficulty Not difficult at all Somewhat difficult     Fall Risk: Fall Risk  02/18/2018 11/22/2017 05/30/2017  10/10/2016 05/27/2015  Falls in the past year? 0 No No No No     Functional Status Survey: Is the patient deaf or have difficulty hearing?: No Does the patient have difficulty seeing, even when wearing glasses/contacts?: No Does the patient have difficulty concentrating, remembering, or making decisions?: No Does the patient have difficulty walking or climbing stairs?: No Does the patient have difficulty dressing or bathing?: No Does the patient have difficulty doing errands alone such as visiting a doctor's office or shopping?: No    Assessment & Plan  1. Vitamin D deficiency  - VITAMIN D 25 Hydroxy (Vit-D Deficiency, Fractures)  2. Other fatigue  If labs are normal it may be secondary to perimenopause and we will try adding SSRI if needed , we will try duloxetine first because it is weight neutral but may  Need to go on Effexor because of coverage  - CBC with Differential/Platelet - VITAMIN D 25 Hydroxy (Vit-D Deficiency, Fractures) - Vitamin B12 - TSH - COMPLETE METABOLIC PANEL WITH GFR  3. Essential (primary) hypertension  - CBC with Differential/Platelet - COMPLETE METABOLIC PANEL WITH GFR  4. Peri-menopausal  Discussed symptoms and therapy options, she would like to check on labs first   5. Lipid screening  - Lipid panel  6. Screening for diabetes mellitus  - Hemoglobin A1c  7. Family history of melanoma  - Ambulatory referral to Dermatology  8. Skin lesion of right arm  -  Ambulatory referral to Dermatology

## 2018-02-19 LAB — COMPLETE METABOLIC PANEL WITH GFR
AG Ratio: 1.6 (calc) (ref 1.0–2.5)
ALBUMIN MSPROF: 4.6 g/dL (ref 3.6–5.1)
ALT: 11 U/L (ref 6–29)
AST: 16 U/L (ref 10–35)
Alkaline phosphatase (APISO): 51 U/L (ref 33–115)
BUN: 12 mg/dL (ref 7–25)
CO2: 31 mmol/L (ref 20–32)
Calcium: 9.8 mg/dL (ref 8.6–10.2)
Chloride: 100 mmol/L (ref 98–110)
Creat: 0.91 mg/dL (ref 0.50–1.10)
GFR, Est African American: 86 mL/min/{1.73_m2} (ref >=60)
GFR, Est Non African American: 74 mL/min/{1.73_m2} (ref >=60)
Globulin: 2.9 g/dL (calc) (ref 1.9–3.7)
Glucose, Bld: 111 mg/dL (ref 65–139)
POTASSIUM: 3.6 mmol/L (ref 3.5–5.3)
Sodium: 139 mmol/L (ref 135–146)
TOTAL PROTEIN: 7.5 g/dL (ref 6.1–8.1)
Total Bilirubin: 0.7 mg/dL (ref 0.2–1.2)

## 2018-02-19 LAB — LIPID PANEL
CHOLESTEROL: 204 mg/dL — AB (ref <200)
HDL: 51 mg/dL (ref >50)
LDL CHOLESTEROL (CALC): 131 mg/dL — AB
NON-HDL CHOLESTEROL (CALC): 153 mg/dL — AB (ref <130)
Total CHOL/HDL Ratio: 4 (calc) (ref <5.0)
Triglycerides: 112 mg/dL (ref <150)

## 2018-02-19 LAB — HEMOGLOBIN A1C
HEMOGLOBIN A1C: 5 %{Hb} (ref <5.7)
Mean Plasma Glucose: 97 (calc)
eAG (mmol/L): 5.4 (calc)

## 2018-02-19 LAB — CBC WITH DIFFERENTIAL/PLATELET
Basophils Absolute: 60 cells/uL (ref 0–200)
Basophils Relative: 1 %
EOS PCT: 3.7 %
Eosinophils Absolute: 222 cells/uL (ref 15–500)
HEMATOCRIT: 40.9 % (ref 35.0–45.0)
HEMOGLOBIN: 14.1 g/dL (ref 11.7–15.5)
LYMPHS ABS: 2154 {cells}/uL (ref 850–3900)
MCH: 33.7 pg — ABNORMAL HIGH (ref 27.0–33.0)
MCHC: 34.5 g/dL (ref 32.0–36.0)
MCV: 97.6 fL (ref 80.0–100.0)
MPV: 11 fL (ref 7.5–12.5)
Monocytes Relative: 9 %
NEUTROS ABS: 3024 {cells}/uL (ref 1500–7800)
Neutrophils Relative %: 50.4 %
Platelets: 247 10*3/uL (ref 140–400)
RBC: 4.19 10*6/uL (ref 3.80–5.10)
RDW: 11.9 % (ref 11.0–15.0)
Total Lymphocyte: 35.9 %
WBC mixed population: 540 cells/uL (ref 200–950)
WBC: 6 10*3/uL (ref 3.8–10.8)

## 2018-02-19 LAB — VITAMIN B12: Vitamin B-12: 459 pg/mL (ref 200–1100)

## 2018-02-19 LAB — TSH: TSH: 1.06 m[IU]/L

## 2018-02-19 LAB — VITAMIN D 25 HYDROXY (VIT D DEFICIENCY, FRACTURES): VIT D 25 HYDROXY: 34 ng/mL (ref 30–100)

## 2018-03-03 ENCOUNTER — Ambulatory Visit
Admission: RE | Admit: 2018-03-03 | Discharge: 2018-03-03 | Disposition: A | Discharge status: Home / Self Care | Payer: BLUE CROSS/BLUE SHIELD | Source: Ambulatory Visit | Attending: Family Medicine | Admitting: Family Medicine

## 2018-03-03 DIAGNOSIS — Z1239 Encounter for other screening for malignant neoplasm of breast: Secondary | ICD-10-CM | POA: Diagnosis not present

## 2018-03-03 DIAGNOSIS — Z1231 Encounter for screening mammogram for malignant neoplasm of breast: Secondary | ICD-10-CM | POA: Diagnosis not present

## 2018-03-20 ENCOUNTER — Other Ambulatory Visit: Payer: Self-pay | Admitting: Family Medicine

## 2018-03-20 DIAGNOSIS — B029 Zoster without complications: Secondary | ICD-10-CM

## 2018-03-20 MED ORDER — VALACYCLOVIR HCL 1 G PO TABS: 1000.0000 mg | ORAL_TABLET | Freq: Three times a day (TID) | ORAL | 0 refills | Status: DC

## 2018-03-20 NOTE — Telephone Encounter (Signed)
Copied from [place] [id number]. Topic: Quick Communication - Rx Refill/Question >> Mar 20, 2018  8:23 AM Keene Breath wrote: Medication: valACYclovir (VALTREX) 1000 MG tablet  Patient called to request a refill for the above medication  Preferred Pharmacy (with phone number or street name): [id number] - [place], [place] - [address] [phone] (Phone) [phone] (Fax)

## 2018-03-20 NOTE — Addendum Note (Signed)
Addended by: Inda Coke on: 03/20/2018 10:33 AM   Modules accepted: Orders

## 2018-04-14 ENCOUNTER — Ambulatory Visit: Payer: BLUE CROSS/BLUE SHIELD | Admitting: Family Medicine

## 2018-04-16 DIAGNOSIS — Z1283 Encounter for screening for malignant neoplasm of skin: Secondary | ICD-10-CM | POA: Diagnosis not present

## 2018-04-16 DIAGNOSIS — L570 Actinic keratosis: Secondary | ICD-10-CM | POA: Diagnosis not present

## 2018-04-16 DIAGNOSIS — Z808 Family history of malignant neoplasm of other organs or systems: Secondary | ICD-10-CM | POA: Diagnosis not present

## 2018-04-16 DIAGNOSIS — D229 Melanocytic nevi, unspecified: Secondary | ICD-10-CM | POA: Diagnosis not present

## 2018-04-16 DIAGNOSIS — D225 Melanocytic nevi of trunk: Secondary | ICD-10-CM | POA: Diagnosis not present

## 2018-04-16 DIAGNOSIS — D485 Neoplasm of uncertain behavior of skin: Secondary | ICD-10-CM | POA: Diagnosis not present

## 2018-05-16 ENCOUNTER — Other Ambulatory Visit: Payer: Self-pay | Admitting: Family Medicine

## 2018-05-16 DIAGNOSIS — R000 Tachycardia, unspecified: Secondary | ICD-10-CM

## 2018-05-16 DIAGNOSIS — I10 Essential (primary) hypertension: Secondary | ICD-10-CM

## 2018-05-21 ENCOUNTER — Ambulatory Visit: Payer: BLUE CROSS/BLUE SHIELD | Admitting: Family Medicine

## 2018-06-16 ENCOUNTER — Ambulatory Visit: Payer: BLUE CROSS/BLUE SHIELD | Admitting: Family Medicine

## 2018-06-27 ENCOUNTER — Other Ambulatory Visit: Payer: Self-pay

## 2018-06-27 ENCOUNTER — Encounter: Payer: Self-pay | Admitting: Family Medicine

## 2018-06-27 ENCOUNTER — Ambulatory Visit (INDEPENDENT_AMBULATORY_CARE_PROVIDER_SITE_OTHER): Payer: BLUE CROSS/BLUE SHIELD | Admitting: Family Medicine

## 2018-06-27 DIAGNOSIS — R000 Tachycardia, unspecified: Secondary | ICD-10-CM

## 2018-06-27 DIAGNOSIS — B029 Zoster without complications: Secondary | ICD-10-CM | POA: Diagnosis not present

## 2018-06-27 DIAGNOSIS — I10 Essential (primary) hypertension: Secondary | ICD-10-CM | POA: Diagnosis not present

## 2018-06-27 MED ORDER — HYDROCHLOROTHIAZIDE 12.5 MG PO CAPS: 12.5000 mg | ORAL_CAPSULE | Freq: Every day | ORAL | 1 refills | Status: DC

## 2018-06-27 MED ORDER — MELATONIN 10 MG PO CAPS: 1.0000 | ORAL_CAPSULE | Freq: Every day | ORAL | 5 refills | Status: DC

## 2018-06-27 MED ORDER — METOPROLOL SUCCINATE ER 25 MG PO TB24: 25.0000 mg | ORAL_TABLET | Freq: Every day | ORAL | 1 refills | Status: DC

## 2018-06-27 MED ORDER — VALACYCLOVIR HCL 1 G PO TABS: 1000.0000 mg | ORAL_TABLET | Freq: Three times a day (TID) | ORAL | 0 refills | Status: DC

## 2018-06-27 NOTE — Progress Notes (Signed)
Name: Erin [name]   MRN: [id number]    DOB: [birth year]   Date:06/27/2018       Progress Note  Subjective  Chief Complaint  Chief Complaint  Patient presents with  . Follow-up    I connected with  Nat Math  on 06/27/18 at  8:20 AM EDT by a video enabled telemedicine application and verified that I am speaking with the correct person using two identifiers.  I discussed the limitations of evaluation and management by telemedicine and the availability of in person appointments. The patient expressed understanding and agreed to proceed. Staff also discussed with the patient that there may be a patient responsible charge related to this service. Patient Location: at home  Provider Location: [facility]   HPI  Fatigue: she states she is doing much better since last visit. Working from home for the past month secondary to COVID-10 and has been less stressed.   HTN: bp at home has been well controlled, except yesterday she felt dizzy while working and felt nervous. , heart rate went up and bp was 139/92, but improved after rest. She states it was like light headed feeling ( she was doing zoom conference with co-worker and supervisor) she was busy at the time. Marland Kitchen No chest pain or SOB. No diaphoresis. She has a history of panic attacks  Insomnia: taking melatonin and doing well, sleeping much better now  Patient Active Problem List   Diagnosis Date Noted  . Panic attack 10/10/2016  . APC (atrial premature contractions) 05/27/2015  . History of varicose veins 05/27/2015  . Vitamin D deficiency 05/27/2015  . Chronic neck pain 05/27/2015  . Fibromyalgia 05/27/2015  . Tachycardia 02/05/2012  . Essential (primary) hypertension 11/04/2008    Past Surgical History:  Procedure Laterality Date  . CESAREAN SECTION  1993    Family History  Problem Relation Age of Onset  . Heart attack Father 63  . Hypertension Father   . Heart attack Brother 53       s/p  stent   . Hyperlipidemia Brother   . Hypertension Brother   . Melanoma Mother   . Breast cancer Neg Hx     Social History   Socioeconomic History  . Marital status: Married    Spouse name: Not on file  . Number of children: 3  . Years of education: Not on file  . Highest education level: Associate degree: occupational, Hotel manager, or vocational program  Occupational History  . Occupation: Education officer, museum  Social Needs  . Financial resource strain: Not hard at all  . Food insecurity:    Worry: Never true    Inability: Never true  . Transportation needs:    Medical: No    Non-medical: No  Tobacco Use  . Smoking status: Never Smoker  . Smokeless tobacco: Never Used  Substance and Sexual Activity  . Alcohol use: No    Alcohol/week: 0.0 standard drinks  . Drug use: No  . Sexual activity: Yes    Partners: Male    Birth control/protection: Surgical  Lifestyle  . Physical activity:    Days per week: 0 days    Minutes per session: 0 min  . Stress: Only a little  Relationships  . Social connections:    Talks on phone: More than three times a week    Gets together: Twice a week    Attends religious service: More than 4 times per year    Active member of club or  organization: Yes    Attends meetings of clubs or organizations: More than 4 times per year    Relationship status: Married  . Intimate partner violence:    Fear of current or ex partner: No    Emotionally abused: No    Physically abused: No    Forced sexual activity: No  Other Topics Concern  . Not on file  Social History Narrative  . Not on file     Current Outpatient Medications:  .  hydrochlorothiazide (MICROZIDE) 12.5 MG capsule, TAKE 1 CAPSULE BY MOUTH EVERY DAY, Disp: 90 capsule, Rfl: 0 .  metoprolol succinate (TOPROL-XL) 25 MG 24 hr tablet, TAKE 1 TABLET BY MOUTH EVERY DAY, Disp: 90 tablet, Rfl: 0 .  Multiple Vitamin (MULTIVITAMIN) tablet, Take 1 tablet by mouth daily., Disp: , Rfl:  .  traZODone  (DESYREL) 50 MG tablet, Take 0.5-1 tablets (25-50 mg total) by mouth every evening. (Patient not taking: Reported on 02/18/2018), Disp: 30 tablet, Rfl: 0 .  valACYclovir (VALTREX) 1000 MG tablet, Take 1 tablet (1,000 mg total) by mouth 3 (three) times daily., Disp: 30 tablet, Rfl: 0  No Known Allergies  I personally reviewed active problem list, medication list, allergies, family history, social history with the patient/caregiver today.   ROS  Ten systems reviewed and is negative except as mentioned in HPI   Objective  Virtual encounter, vitals not obtained.  There is no height or weight on file to calculate BMI.  Physical Exam   Awake, alert in no distress and well groomed   PHQ2/9: Depression screen [facility] 2/9 06/27/2018 02/18/2018 11/22/2017 05/30/2017 10/10/2016  Decreased Interest 0 1 0 0 0  Down, Depressed, Hopeless 0 0 0 0 0  PHQ - 2 Score 0 1 0 0 0  Altered sleeping 1 3 2  - -  Tired, decreased energy 1 3 1  - -  Change in appetite 1 0 0 - -  Feeling bad or failure about yourself  0 0 0 - -  Trouble concentrating 0 0 0 - -  Moving slowly or fidgety/restless 0 0 0 - -  Suicidal thoughts 0 0 0 - -  PHQ-9 Score 3 7 3  - -  Difficult doing work/chores - Somewhat difficult Not difficult at all - -   PHQ-2/9 Result is negative.    Fall Risk: Fall Risk  06/27/2018 02/18/2018 11/22/2017 05/30/2017 10/10/2016  Falls in the past year? 0 0 No No No  Number falls in past yr: 0 - - - -  Injury with Fall? 0 - - - -     Assessment & Plan  1. Essential (primary) hypertension  - hydrochlorothiazide (MICROZIDE) 12.5 MG capsule; Take 1 capsule (12.5 mg total) by mouth daily.  Dispense: 90 capsule; Refill: 1 - metoprolol succinate (TOPROL-XL) 25 MG 24 hr tablet; Take 1 tablet (25 mg total) by mouth daily.  Dispense: 90 tablet; Refill: 1  2. Tachycardia  - metoprolol succinate (TOPROL-XL) 25 MG 24 hr tablet; Take 1 tablet (25 mg total) by mouth daily.  Dispense: 90 tablet; Refill: 1  3.  Herpes zoster without complication  - valACYclovir (VALTREX) 1000 MG tablet; Take 1 tablet (1,000 mg total) by mouth 3 (three) times daily.  Dispense: 90 tablet; Refill: 0  I discussed the assessment and treatment plan with the patient. The patient was provided an opportunity to ask questions and all were answered. The patient agreed with the plan and demonstrated an understanding of the instructions.  The patient was advised to  call back or seek an in-person evaluation if the symptoms worsen or if the condition fails to improve as anticipated.  I provided 25 minutes of non-face-to-face time during this encounter.

## 2018-07-26 ENCOUNTER — Other Ambulatory Visit: Payer: Self-pay | Admitting: Family Medicine

## 2018-07-26 DIAGNOSIS — B029 Zoster without complications: Secondary | ICD-10-CM

## 2018-07-28 NOTE — Telephone Encounter (Signed)
Pt is scheduled for Med Refill and CPE

## 2018-08-12 ENCOUNTER — Other Ambulatory Visit: Payer: Self-pay | Admitting: Family Medicine

## 2018-08-12 DIAGNOSIS — B029 Zoster without complications: Secondary | ICD-10-CM

## 2018-08-18 ENCOUNTER — Encounter: Payer: Self-pay | Admitting: Nurse Practitioner

## 2018-08-18 ENCOUNTER — Ambulatory Visit: Payer: Self-pay

## 2018-08-18 ENCOUNTER — Ambulatory Visit (INDEPENDENT_AMBULATORY_CARE_PROVIDER_SITE_OTHER): Payer: BC Managed Care – PPO | Admitting: Nurse Practitioner

## 2018-08-18 DIAGNOSIS — Z20822 Contact with and (suspected) exposure to covid-19: Secondary | ICD-10-CM

## 2018-08-18 DIAGNOSIS — Z20828 Contact with and (suspected) exposure to other viral communicable diseases: Secondary | ICD-10-CM

## 2018-08-18 NOTE — Telephone Encounter (Signed)
Phone call from pt.  Stated she was exposed to [place] 19.  Reported spending about 5 min., talking with a friend, last Tues., standing approx. 4-5 feet apart.  Reported she later found out the friend began having symptoms on Wed., and was diag. With positive for COVID on Saturday.  Pt. Denied any symptoms; denied cough, fever, SOB, sore throat, diarrhea, or loss of taste/ smell.  Stated she works for [employer], and wants to be sure she does not have COVID, due to potential of exposing other people.  Called office; transferred to [facility] to schedule Virtual Visit.  Pt. Agreed with plan.       Reason for Disposition . [1] COVID-19 EXPOSURE (Close Contact) within last 14 days AND [2] needs COVID-19 lab test to return to work AND [3] NO symptoms  Answer Assessment - Initial Assessment Questions 1. CLOSE CONTACT: "Who is the person with the confirmed or suspected COVID-19 infection that you were exposed to?"    Friend 2. PLACE of CONTACT: "Where were you when you were exposed to COVID-19?" (e.g., home, school, medical waiting room; which city?)    In a building 3. TYPE of CONTACT: "How much contact was there?" (e.g., sitting next to, live in same house, work in same office, same building)     In close proximity and talking to individual  4. DURATION of CONTACT: "How long were you in contact with the COVID-19 patient?" (e.g., a few seconds, passed by person, a few minutes, live with the patient)    Approx. 5 min. ; about 4-5 feet apart 5. DATE of CONTACT: "When did you have contact with a COVID-19 patient?" (e.g., how many days ago)    08/12/18 6. TRAVEL: "Have you traveled out of the country recently?" If so, "When and where?"     * Also ask about out-of-state travel, since the CDC has identified some high-risk cities for community spread in the [place].     * Note: Travel becomes less relevant if there is widespread community transmission where the patient lives.     No travel in past 14 days.  7. COMMUNITY SPREAD:  "Are there lots of cases of COVID-19 (community spread) where you live?" (See public health department website, if unsure)      Present in community 8. SYMPTOMS: "Do you have any symptoms?" (e.g., fever, cough, breathing difficulty)     Denied any symptoms  9. PREGNANCY OR POSTPARTUM: "Is there any chance you are pregnant?" "When was your last menstrual period?" "Did you deliver in the last 2 weeks?"     LMP; last week 10. HIGH RISK: "Do you have any heart or lung problems? Do you have a weak immune system?" (e.g., CHF, COPD, asthma, HIV positive, chemotherapy, renal failure, diabetes mellitus, sickle cell anemia)      High blood pressure, other wise  Protocols used: CORONAVIRUS (COVID-19) EXPOSURE-A-AH

## 2018-08-18 NOTE — Progress Notes (Signed)
Virtual Visit via Video Note  I connected with Erin [name] on 08/18/18 at  3:40 PM EDT by a video enabled telemedicine application and verified that I am speaking with the correct person using two identifiers.   Staff discussed the limitations of evaluation and management by telemedicine and the availability of in person appointments. The patient expressed understanding and agreed to proceed.  Patient location: home  My location: work office Other people present: none HPI  Patient presents for possible to COVID 19.  Reported spending about 5 min., talking with a friend, last Tues., standing approx. 4-5 feet apart.  Reported she later found out the friend began having symptoms on Wed., and was diag. With positive for COVID on Saturday.  Pt. Denied any symptoms; denied cough, fever, SOB, sore throat, diarrhea, or loss of taste/ smell, headache. 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  PHQ2/9: Depression screen [facility] 2/9 08/18/2018 06/27/2018 02/18/2018 11/22/2017 05/30/2017  Decreased Interest 0 0 1 0 0  Down, Depressed, Hopeless 0 0 0 0 0  PHQ - 2 Score 0 0 1 0 0  Altered sleeping 0 1 3 2  -  Tired, decreased energy 0 1 3 1  -  Change in appetite 0 1 0 0 -  Feeling bad or failure about yourself  0 0 0 0 -  Trouble concentrating 0 0 0 0 -  Moving slowly or fidgety/restless 0 0 0 0 -  Suicidal thoughts 0 0 0 0 -  PHQ-9 Score 0 3 7 3  -  Difficult doing work/chores Not difficult at all - Somewhat difficult Not difficult at all -     PHQ reviewed. Negative  Patient Active Problem List   Diagnosis Date Noted  . Panic attack 10/10/2016  . APC (atrial premature contractions) 05/27/2015  . History of varicose veins 05/27/2015  . Vitamin D deficiency 05/27/2015  . Chronic neck pain 05/27/2015  . Fibromyalgia 05/27/2015  . Tachycardia 02/05/2012  . Essential (primary) hypertension 11/04/2008    Past Medical History:  Diagnosis Date  .  Hypertension   . Supraventricular premature beats   . Vein, varicose     Past Surgical History:  Procedure Laterality Date  . CESAREAN SECTION  1993    Social History   Tobacco Use  . Smoking status: Never Smoker  . Smokeless tobacco: Never Used  Substance Use Topics  . Alcohol use: No    Alcohol/week: 0.0 standard drinks     Current Outpatient Medications:  .  hydrochlorothiazide (MICROZIDE) 12.5 MG capsule, Take 1 capsule (12.5 mg total) by mouth daily., Disp: 90 capsule, Rfl: 1 .  Melatonin 10 MG CAPS, Take 1 capsule by mouth daily., Disp: 30 capsule, Rfl: 5 .  metoprolol succinate (TOPROL-XL) 25 MG 24 hr tablet, Take 1 tablet (25 mg total) by mouth daily., Disp: 90 tablet, Rfl: 1 .  Multiple Vitamin (MULTIVITAMIN) tablet, Take 1 tablet by mouth daily., Disp: , Rfl:  .  valACYclovir (VALTREX) 1000 MG tablet, TAKE 1 TABLET BY MOUTH THREE TIMES A DAY, Disp: 270 tablet, Rfl: 1  No Known Allergies  ROS   No other specific complaints in a complete review of systems (except as listed in HPI above).  Objective  There were no vitals filed for this visit.   There is no height or weight on file to calculate BMI.  Nursing Note and Vital Signs reviewed.  Physical Exam  Constitutional: Patient appears well-developed and well-nourished. No distress.  HENT: Head: Normocephalic and atraumatic. Pulmonary/Chest: Effort normal  Musculoskeletal: Normal range of motion,  Neurological: alert and oriented, speech normal.  Psychiatric: Patient has a normal mood and affect. behavior is normal. Judgment and thought content normal.    Assessment & Plan  1. Exposure to Covid-19 Virus Unable to do testing at this time recommend self isolation for 14 days since last exposure and monitor for symptoms. Provided [facility] testing line number. Discussed OTC treatment can test if symptomatic due to essential work.     Follow Up Instructions:   PRN  I discussed the assessment and treatment  plan with the patient. The patient was provided an opportunity to ask questions and all were answered. The patient agreed with the plan and demonstrated an understanding of the instructions.   The patient was advised to call back or seek an in-person evaluation if the symptoms worsen or if the condition fails to improve as anticipated.  I provided 12 minutes of non-face-to-face time during this encounter.   Fredderick Severance, NP

## 2018-08-18 NOTE — Patient Instructions (Signed)
I suspect you may have COVID-19/coronavirus and recommend that you are tested to confirm.  Please call [facility]'s COVID-19 hotline at [phone] to determine if you are eligible for testing.

## 2018-09-16 ENCOUNTER — Other Ambulatory Visit: Payer: Self-pay

## 2018-09-16 ENCOUNTER — Encounter: Payer: Self-pay | Admitting: Family Medicine

## 2018-09-16 ENCOUNTER — Ambulatory Visit (INDEPENDENT_AMBULATORY_CARE_PROVIDER_SITE_OTHER): Payer: BC Managed Care – PPO | Admitting: Family Medicine

## 2018-09-16 VITALS — BP 129/90

## 2018-09-16 DIAGNOSIS — M79671 Pain in right foot: Secondary | ICD-10-CM

## 2018-09-16 NOTE — Progress Notes (Signed)
Name: Erin [name]   MRN: [id number]    DOB: 09/11/1970   Date:09/16/2018       Progress Note  Subjective  Chief Complaint  Chief Complaint  Patient presents with  . Toe Injury    Onset- 9 p.m. last night, Stumble her toe on the brick fire place on right foot next to her little toe. Her toe was kind of sideways and had to move it to be able to walk. Pain is tolerate able but worst with pressure or walking. She has been elevating it and resting.     I connected with  Nat Math  on 09/16/18 at 10:20 AM EDT by a video enabled telemedicine application and verified that I am speaking with the correct person using two identifiers.  I discussed the limitations of evaluation and management by telemedicine and the availability of in person appointments. The patient expressed understanding and agreed to proceed. Staff also discussed with the patient that there may be a patient responsible charge related to this service. Patient Location: at home  Provider Location: [facility]   HPI  Right foot pain she stumped her right foot on the corner of her fire place last night, she states right 4th toe was dislocated and she had to manually put it in place, since than she has severe pain when bearing weight , bruise on top of right lateral column of foot and above 4th toe, some swelling. She has taken tylenol and improved the pain slightly but still 4/10 at this time  Patient Active Problem List   Diagnosis Date Noted  . Panic attack 10/10/2016  . APC (atrial premature contractions) 05/27/2015  . History of varicose veins 05/27/2015  . Vitamin D deficiency 05/27/2015  . Chronic neck pain 05/27/2015  . Fibromyalgia 05/27/2015  . Tachycardia 02/05/2012  . Essential (primary) hypertension 11/04/2008    Past Surgical History:  Procedure Laterality Date  . CESAREAN SECTION  1993    Family History  Problem Relation Age of Onset  . Heart attack Father 51  .  Hypertension Father   . Heart attack Brother 24       s/p stent   . Hyperlipidemia Brother   . Hypertension Brother   . Melanoma Mother   . Breast cancer Neg Hx     Social History   Socioeconomic History  . Marital status: Married    Spouse name: Not on file  . Number of children: 3  . Years of education: Not on file  . Highest education level: Associate degree: occupational, Hotel manager, or vocational program  Occupational History  . Occupation: Education officer, museum  Social Needs  . Financial resource strain: Not hard at all  . Food insecurity    Worry: Never true    Inability: Never true  . Transportation needs    Medical: No    Non-medical: No  Tobacco Use  . Smoking status: Never Smoker  . Smokeless tobacco: Never Used  Substance and Sexual Activity  . Alcohol use: No    Alcohol/week: 0.0 standard drinks  . Drug use: No  . Sexual activity: Yes    Partners: Male    Birth control/protection: Surgical  Lifestyle  . Physical activity    Days per week: 0 days    Minutes per session: 0 min  . Stress: Only a little  Relationships  . Social connections    Talks on phone: More than three times a week    Gets together: Twice  a week    Attends religious service: More than 4 times per year    Active member of club or organization: Yes    Attends meetings of clubs or organizations: More than 4 times per year    Relationship status: Married  . Intimate partner violence    Fear of current or ex partner: No    Emotionally abused: No    Physically abused: No    Forced sexual activity: No  Other Topics Concern  . Not on file  Social History Narrative  . Not on file     Current Outpatient Medications:  .  hydrochlorothiazide (MICROZIDE) 12.5 MG capsule, Take 1 capsule (12.5 mg total) by mouth daily., Disp: 90 capsule, Rfl: 1 .  Melatonin 10 MG CAPS, Take 1 capsule by mouth daily., Disp: 30 capsule, Rfl: 5 .  metoprolol succinate (TOPROL-XL) 25 MG 24 hr tablet, Take 1 tablet  (25 mg total) by mouth daily., Disp: 90 tablet, Rfl: 1 .  Multiple Vitamin (MULTIVITAMIN) tablet, Take 1 tablet by mouth daily., Disp: , Rfl:  .  valACYclovir (VALTREX) 1000 MG tablet, TAKE 1 TABLET BY MOUTH THREE TIMES A DAY (Patient taking differently: Take 1,000 mg by mouth as needed. ), Disp: 270 tablet, Rfl: 1  No Known Allergies  I personally reviewed active problem list, medication list, allergies, family history, social history with the patient/caregiver today.   ROS  Ten systems reviewed and is negative except as mentioned in HPI   Objective  Virtual encounter, vitals not obtained.  There is no height or weight on file to calculate BMI.  Physical Exam  Awake, alert and oriented Some bruise and swelling on top of right foot above 4th and 5th toes   PHQ2/9: Depression screen [facility] 2/9 09/16/2018 08/18/2018 06/27/2018 02/18/2018 11/22/2017  Decreased Interest 0 0 0 1 0  Down, Depressed, Hopeless 0 0 0 0 0  PHQ - 2 Score 0 0 0 1 0  Altered sleeping 0 0 1 3 2   Tired, decreased energy 0 0 1 3 1   Change in appetite 0 0 1 0 0  Feeling bad or failure about yourself  0 0 0 0 0  Trouble concentrating 0 0 0 0 0  Moving slowly or fidgety/restless 0 0 0 0 0  Suicidal thoughts 0 0 0 0 0  PHQ-9 Score 0 0 3 7 3   Difficult doing work/chores Not difficult at all Not difficult at all - Somewhat difficult Not difficult at all   PHQ-2/9 Result is negative.    Fall Risk: Fall Risk  09/16/2018 08/18/2018 06/27/2018 02/18/2018 11/22/2017  Falls in the past year? 0 0 0 0 No  Number falls in past yr: 0 - 0 - -  Injury with Fall? 0 - 0 - -     Assessment & Plan  1. Right foot pain  - Ambulatory referral to Podiatry Advise to elevated, rest , ice it and may take nsaid's if needed   I discussed the assessment and treatment plan with the patient. The patient was provided an opportunity to ask questions and all were answered. The patient agreed with the plan and demonstrated an understanding of  the instructions.  The patient was advised to call back or seek an in-person evaluation if the symptoms worsen or if the condition fails to improve as anticipated.  I provided 15 minutes of non-face-to-face time during this encounter.

## 2018-09-18 ENCOUNTER — Ambulatory Visit: Payer: BC Managed Care – PPO | Admitting: Podiatry

## 2018-09-18 ENCOUNTER — Other Ambulatory Visit: Payer: Self-pay

## 2018-09-18 ENCOUNTER — Encounter: Payer: Self-pay | Admitting: Podiatry

## 2018-09-18 ENCOUNTER — Other Ambulatory Visit: Payer: Self-pay | Admitting: Podiatry

## 2018-09-18 ENCOUNTER — Ambulatory Visit (INDEPENDENT_AMBULATORY_CARE_PROVIDER_SITE_OTHER): Payer: BC Managed Care – PPO

## 2018-09-18 VITALS — BP 138/90 | HR 73 | Temp 98.1°F

## 2018-09-18 DIAGNOSIS — S92514A Nondisplaced fracture of proximal phalanx of right lesser toe(s), initial encounter for closed fracture: Secondary | ICD-10-CM

## 2018-09-18 DIAGNOSIS — S92911A Unspecified fracture of right toe(s), initial encounter for closed fracture: Secondary | ICD-10-CM | POA: Diagnosis not present

## 2018-09-18 DIAGNOSIS — S92515A Nondisplaced fracture of proximal phalanx of left lesser toe(s), initial encounter for closed fracture: Secondary | ICD-10-CM

## 2018-09-18 DIAGNOSIS — S92912A Unspecified fracture of left toe(s), initial encounter for closed fracture: Secondary | ICD-10-CM | POA: Insufficient documentation

## 2018-09-18 NOTE — Progress Notes (Signed)
This patient presents the office with chief complaint of a painful fourth toe on her right foot.  She says that she bumped her toe on the fireplace 4 days ago and she heard a crack.  Since that time she has had pain and discomfort noted in the fourth toe of the right foot.  She says she experiences pain and discomfort when she walks.  She says that she previously had broken other toes which were successfully treated with a surgical shoe.  She has taken Tylenol for pain and applied ice.  She presents the office today for an evaluation of this fourth toe right foot   Vascular  Dorsalis pedis and posterior tibial pulses are palpable  B/L.  Capillary return  WNL.  Temperature gradient is  WNL.  Skin turgor  WNL  Sensorium  Senn Weinstein monofilament wire  WNL. Normal tactile sensation.  Nail Exam  Patient has normal nails with no evidence of bacterial or fungal infection.  Orthopedic  Exam  Muscle tone and muscle strength  WNL.  No limitations of motion feet  B/L.  No crepitus or joint effusion noted.  Foot type is unremarkable and digits show no abnormalities.  Bony prominences are unremarkable. Palpable pain fourth toe right foot.  Mild swelling fourth toe.  Skin  No open lesions.  Normal skin texture and turgor.  Closed fracture fourth toe right foot.  IE.  After close examination there is a fracture at the midshaft of the proximal phalanx fourth toe left foot.  There is near perfect alignment at the fracture site.  Discussed this condition with this patient.  Told her we need to treat this as a fracture due to the clinical pain findings.  Buddy tape her fourth to her fifth toe was performed using Coban.  Dispensed a surgical shoe for her to wear until the toe pain resolves.  RTC 4 weeks.   Gardiner Barefoot DPM

## 2018-10-14 DIAGNOSIS — H5213 Myopia, bilateral: Secondary | ICD-10-CM | POA: Diagnosis not present

## 2018-10-16 ENCOUNTER — Ambulatory Visit: Payer: BC Managed Care – PPO | Admitting: Podiatry

## 2018-10-20 ENCOUNTER — Ambulatory Visit: Payer: BC Managed Care – PPO | Admitting: Podiatry

## 2018-10-30 ENCOUNTER — Ambulatory Visit (INDEPENDENT_AMBULATORY_CARE_PROVIDER_SITE_OTHER): Payer: BC Managed Care – PPO

## 2018-10-30 ENCOUNTER — Ambulatory Visit: Payer: BC Managed Care – PPO

## 2018-10-30 ENCOUNTER — Encounter: Payer: Self-pay | Admitting: Podiatry

## 2018-10-30 ENCOUNTER — Other Ambulatory Visit: Payer: Self-pay

## 2018-10-30 ENCOUNTER — Ambulatory Visit: Payer: BC Managed Care – PPO | Admitting: Podiatry

## 2018-10-30 ENCOUNTER — Other Ambulatory Visit: Payer: Self-pay | Admitting: Podiatry

## 2018-10-30 DIAGNOSIS — S92911A Unspecified fracture of right toe(s), initial encounter for closed fracture: Secondary | ICD-10-CM

## 2018-10-30 DIAGNOSIS — S92515A Nondisplaced fracture of proximal phalanx of left lesser toe(s), initial encounter for closed fracture: Secondary | ICD-10-CM | POA: Diagnosis not present

## 2018-10-30 DIAGNOSIS — M79671 Pain in right foot: Secondary | ICD-10-CM

## 2018-10-30 NOTE — Progress Notes (Signed)
This patient returns to the office follow-up for diagnosis of a closed proximal phalanx fracture fourth toe left foot.  She states she is experiencing more pain and discomfort and is concerned about the fracture site.  She says she is having more pain and throbbing especially in the evening after a day's activity.  She says she has been walking with her wooden shoe.  She presents the office today for continued evaluation and treatment of this fracture site.  General Appearance  Alert, conversant and in no acute stress.  Vascular  Dorsalis pedis and posterior tibial  pulses are palpable  bilaterally.  Capillary return is within normal limits  bilaterally. Temperature is within normal limits  bilaterally.  Neurologic  Senn-Weinstein monofilament wire test within normal limits  bilaterally. Muscle power within normal limits bilaterally.  Nails Thick disfigured discolored nails with subungual debris  from hallux to fifth toes bilaterally. No evidence of bacterial infection or drainage bilaterally.  Orthopedic  No limitations of motion  feet .  No crepitus or effusions noted.  Mild swelling noted fourth toe left foot.  Patient has pain elicited upon ROM PIPJ 4th toe left foot.  Skin  normotropic skin with no porokeratosis noted bilaterally.  No signs of infections or ulcers noted.   Closed toe fracture fourth toe left foot.  ROV.  Discussed this condition with this patient.  We decided to take an additional x-ray to check the status of the fracture.  Examination of the x-ray reveals good positioning of the fracture of the proximal phalanx of the fourth toe.  Patient was told to continue wearing the surgical shoe and take medicine by mouth for pain.  She says she is not interested in any pain medicine.  She just wants to make sure that this is healing as expected.  Told the patient it is healing well but it still requires 6 to 12 weeks for bone to heal.  Patient to return to the office in 6 weeks for  further evaluation and treatment told this patient let pain be her guide.  Gardiner Barefoot DPM

## 2018-10-31 ENCOUNTER — Encounter: Payer: Self-pay | Admitting: Family Medicine

## 2018-10-31 ENCOUNTER — Ambulatory Visit: Payer: BC Managed Care – PPO | Admitting: Family Medicine

## 2018-10-31 VITALS — BP 140/90 | HR 80 | Temp 98.6°F | Resp 16 | Ht 63.0 in | Wt 145.0 lb

## 2018-10-31 DIAGNOSIS — R21 Rash and other nonspecific skin eruption: Secondary | ICD-10-CM | POA: Diagnosis not present

## 2018-10-31 MED ORDER — TRIAMCINOLONE ACETONIDE 0.1 % EX CREA: 1.0000 "application " | TOPICAL_CREAM | Freq: Two times a day (BID) | CUTANEOUS | 0 refills | Status: DC

## 2018-10-31 MED ORDER — HYDROXYZINE HCL 10 MG PO TABS: 10.0000 mg | ORAL_TABLET | Freq: Three times a day (TID) | ORAL | 0 refills | Status: DC | PRN

## 2018-10-31 NOTE — Progress Notes (Signed)
Name: Erin [name]   MRN: [id number]    DOB: 11-21-1970   Date:10/31/2018       Progress Note  Subjective  Chief Complaint  Chief Complaint  Patient presents with  . Rash    All over her body x 4 days. Rash is on her left flank side, back of her legs and buttocks. Looks more like bites. Itches worse at night. Has been treating with Hydrocortisone cream and triple antibiotic ointment and valtrex with only temporary relief. She has changed detergents or lotions.    HPI  Rash: she states she kept her grandchildren over the weekend and on Monday she noticed some bumps on her right upper quadrant, very itchy. Later in the week she noticed some on her back, on left foot and right leg. She states they checked the house for bed bugs but did not find anything, she is not sure if getting new lesions, but knows for sure that started on her left foot and that one is clearing . She is worried about it. She denies fever, chills, no nausea, vomiting, change in appetite, she took some valtrex because she was worried that it was shingles but explained not shingles based on multiple dermatomes.   Patient Active Problem List   Diagnosis Date Noted  . Toe fracture, left 09/18/2018  . Panic attack 10/10/2016  . APC (atrial premature contractions) 05/27/2015  . History of varicose veins 05/27/2015  . Vitamin D deficiency 05/27/2015  . Chronic neck pain 05/27/2015  . Fibromyalgia 05/27/2015  . Tachycardia 02/05/2012  . Essential (primary) hypertension 11/04/2008    Social History   Tobacco Use  . Smoking status: Never Smoker  . Smokeless tobacco: Never Used  Substance Use Topics  . Alcohol use: No    Alcohol/week: 0.0 standard drinks     Current Outpatient Medications:  .  hydrochlorothiazide (MICROZIDE) 12.5 MG capsule, Take 1 capsule (12.5 mg total) by mouth daily., Disp: 90 capsule, Rfl: 1 .  Melatonin 10 MG CAPS, Take 1 capsule by mouth daily., Disp: 30 capsule, Rfl: 5 .  metoprolol  succinate (TOPROL-XL) 25 MG 24 hr tablet, Take 1 tablet (25 mg total) by mouth daily., Disp: 90 tablet, Rfl: 1 .  Multiple Vitamin (MULTIVITAMIN) tablet, Take 1 tablet by mouth daily., Disp: , Rfl:  .  valACYclovir (VALTREX) 1000 MG tablet, TAKE 1 TABLET BY MOUTH THREE TIMES A DAY (Patient taking differently: Take 1,000 mg by mouth as needed. ), Disp: 270 tablet, Rfl: 1  No Known Allergies  ROS  Ten systems reviewed and is negative except as mentioned in HPI   Objective  Vitals:   10/31/18 1434  BP: 140/90  Pulse: 80  Resp: 16  Temp: 98.6 F (37 C)  TempSrc: Temporal  SpO2: 98%  Weight: 145 lb (65.8 kg)  Height: 5\' 3"  (1.6 m)    Body mass index is 25.69 kg/m.    Physical Exam  Constitutional: Patient appears well-developed and well-nourished.No distress.  HEENT: head atraumatic, normocephalic, pupils equal and reactive to light, neck supple Cardiovascular: Normal rate, regular rhythm and normal heart sounds.  No murmur heard. No BLE edema. Pulmonary/Chest: Effort normal and breath sounds normal. No respiratory distress. Abdominal: Soft.  There is no tenderness. Skin: erythematous nodules, very small , like insect bites, no oozing, signs of infection, some excoriations, some were in groups of three but some isolated.  Psychiatric: Patient has a normal mood and affect. behavior is normal. Judgment and thought content normal.  Assessment &  Plan  1. Rash in adult  - hydrOXYzine (ATARAX/VISTARIL) 10 MG tablet; Take 1 tablet (10 mg total) by mouth 3 (three) times daily as needed.  Dispense: 30 tablet; Refill: 0 - triamcinolone cream (KENALOG) 0.1 %; Apply 1 application topically 2 (two) times daily.  Dispense: 30 g; Refill: 0

## 2018-12-15 ENCOUNTER — Encounter: Payer: Self-pay | Admitting: Podiatry

## 2018-12-15 ENCOUNTER — Other Ambulatory Visit: Payer: Self-pay

## 2018-12-15 ENCOUNTER — Ambulatory Visit: Payer: BC Managed Care – PPO | Admitting: Podiatry

## 2018-12-15 DIAGNOSIS — S92511D Displaced fracture of proximal phalanx of right lesser toe(s), subsequent encounter for fracture with routine healing: Secondary | ICD-10-CM

## 2018-12-15 DIAGNOSIS — S92501A Displaced unspecified fracture of right lesser toe(s), initial encounter for closed fracture: Secondary | ICD-10-CM | POA: Insufficient documentation

## 2018-12-15 NOTE — Progress Notes (Signed)
This patient returns to the office follow-up for diagnosis of a closed proximal phalanx fracture fourth toe right  foot.  She states she is experiencing less pain about the fracture site  .  She says she has been wearing most shoes..  She presents the office today for continued evaluation and treatment of this fracture site.  General Appearance  Alert, conversant and in no acute stress.  Vascular  Dorsalis pedis and posterior tibial  pulses are palpable  bilaterally.  Capillary return is within normal limits  bilaterally. Temperature is within normal limits  bilaterally.  Neurologic  Senn-Weinstein monofilament wire test within normal limits  bilaterally. Muscle power within normal limits bilaterally.  Nails Thick disfigured discolored nails with subungual debris  from hallux to fifth toes bilaterally. No evidence of bacterial infection or drainage bilaterally.  Orthopedic  No limitations of motion  feet .  No crepitus or effusions noted.  Mild swelling noted fourth toe right  foot.  Patient has pain elicited upon ROM PIPJ 4th toe right  foot.  Skin  normotropic skin with no porokeratosis noted bilaterally.  No signs of infections or ulcers noted.   Closed toe fracture fourth toe right  foot.  ROV.  Discussed this condition with this patient.  Patient is doing better clinically.  Reduction and swelling and mild pain elicited fourth toe.  Told her to allow the toe to continue healing.  RTC prn.   Gardiner Barefoot DPM

## 2018-12-26 ENCOUNTER — Encounter: Payer: BLUE CROSS/BLUE SHIELD | Admitting: Family Medicine

## 2019-01-06 ENCOUNTER — Other Ambulatory Visit: Payer: Self-pay

## 2019-01-06 ENCOUNTER — Encounter: Payer: Self-pay | Admitting: Family Medicine

## 2019-01-06 ENCOUNTER — Ambulatory Visit (INDEPENDENT_AMBULATORY_CARE_PROVIDER_SITE_OTHER): Payer: BC Managed Care – PPO | Admitting: Family Medicine

## 2019-01-06 VITALS — BP 134/86 | HR 86 | Temp 97.1°F | Resp 16 | Ht 62.5 in | Wt 145.1 lb

## 2019-01-06 DIAGNOSIS — Z1211 Encounter for screening for malignant neoplasm of colon: Secondary | ICD-10-CM

## 2019-01-06 DIAGNOSIS — I10 Essential (primary) hypertension: Secondary | ICD-10-CM

## 2019-01-06 DIAGNOSIS — Z23 Encounter for immunization: Secondary | ICD-10-CM | POA: Diagnosis not present

## 2019-01-06 DIAGNOSIS — Z1159 Encounter for screening for other viral diseases: Secondary | ICD-10-CM | POA: Diagnosis not present

## 2019-01-06 DIAGNOSIS — Z0000 Encounter for general adult medical examination without abnormal findings: Secondary | ICD-10-CM | POA: Diagnosis not present

## 2019-01-06 DIAGNOSIS — Z1231 Encounter for screening mammogram for malignant neoplasm of breast: Secondary | ICD-10-CM

## 2019-01-06 DIAGNOSIS — Z131 Encounter for screening for diabetes mellitus: Secondary | ICD-10-CM | POA: Diagnosis not present

## 2019-01-06 DIAGNOSIS — Z114 Encounter for screening for human immunodeficiency virus [HIV]: Secondary | ICD-10-CM

## 2019-01-06 DIAGNOSIS — Z13220 Encounter for screening for lipoid disorders: Secondary | ICD-10-CM | POA: Diagnosis not present

## 2019-01-06 NOTE — Progress Notes (Signed)
Name: Erin [name]   MRN: [id number]    DOB: 1970/04/04   Date:01/06/2019       Progress Note  Subjective  Chief Complaint  Chief Complaint  Patient presents with  . Well woman exam    HPI  Patient presents for annual CPE.  Diet: discussed healthy diet Exercise: not physically active   USPSTF grade A and B recommendations    Office Visit from 09/16/2018 in [facility]  AUDIT-C Score  0     Depression: Phq 9 is  Positive - she states since COVID-19   Depression screen [facility] 2/9 01/06/2019 10/31/2018 09/16/2018 08/18/2018 06/27/2018  Decreased Interest 1 0 0 0 0  Down, Depressed, Hopeless 0 0 0 0 0  PHQ - 2 Score 1 0 0 0 0  Altered sleeping 2 0 0 0 1  Tired, decreased energy 1 0 0 0 1  Change in appetite 0 0 0 0 1  Feeling bad or failure about yourself  0 0 0 0 0  Trouble concentrating 0 0 0 0 0  Moving slowly or fidgety/restless 0 0 0 0 0  Suicidal thoughts 0 0 0 0 0  PHQ-9 Score 4 0 0 0 3  Difficult doing work/chores Not difficult at all - Not difficult at all Not difficult at all -   Hypertension: BP Readings from Last 3 Encounters:  01/06/19 134/86  10/31/18 140/90  09/18/18 138/90   Obesity: Wt Readings from Last 3 Encounters:  01/06/19 145 lb 1.6 oz (65.8 kg)  10/31/18 145 lb (65.8 kg)  02/18/18 142 lb 3.2 oz (64.5 kg)   BMI Readings from Last 3 Encounters:  01/06/19 26.12 kg/m  10/31/18 25.69 kg/m  02/18/18 25.19 kg/m     Hep C Screening: today  STD testing and prevention (HIV/chl/gon/syphilis): discussed getting HIV once, not interested on other labs  Intimate partner violence: negative screen  Sexual History/Pain during Intercourse: no pain during intercourse of vaginal discharge Menstrual History/LMP/Abnormal Bleeding: regular cycles, discussed perimenopausal symptoms  Incontinence Symptoms: occasional when she sneezes when bladder is full , discussed kegel exercises  Breast cancer:  - Last Mammogram: 2019  - BRCA gene  screening: N/A  Osteoporosis:  discussed high calcium and vitamin D diet  Cervical cancer screening: last one in 2017, discussed repeat in 2022   Skin cancer: discussed atypical lesions , she has a family history of melanoma and she goes to [place] Colorectal cancer: Discussed USPSTF and different ways to screen ( Cologuard, Colonoscopy versus hemoccult stools) American College of Gastroenterology recommends screen of AA at age 49   Lung cancer:  Low Dose CT Chest recommended if Age 71-80 years, 30 pack-year currently smoking OR have quit w/in 15years. Patient does not qualify.   [id number]  Advanced Care Planning: A voluntary discussion about advance care planning including the explanation and discussion of advance directives.  Discussed health care proxy and Living will, and the patient was able to identify a health care proxy as husband .  Patient does not have a living will at present time.   Lipids: Lab Results  Component Value Date   CHOL 204 (H) 02/18/2018   CHOL 172 05/30/2017   Lab Results  Component Value Date   HDL 51 02/18/2018   HDL 49 (L) 05/30/2017   Lab Results  Component Value Date   LDLCALC 131 (H) 02/18/2018   LDLCALC 109 (H) 05/30/2017   Lab Results  Component Value Date   TRIG 112  02/18/2018   TRIG 59 05/30/2017   Lab Results  Component Value Date   CHOLHDL 4.0 02/18/2018   CHOLHDL 3.5 05/30/2017   No results found for: LDLDIRECT  Glucose: Glucose  Date Value Ref Range Status  02/02/2012 90 65 - 99 mg/dL Final   Glucose, Bld  Date Value Ref Range Status  02/18/2018 111 65 - 139 mg/dL Final    Comment:    .        Non-fasting reference interval .   05/30/2017 87 65 - 99 mg/dL Final    Comment:    .            Fasting reference interval .   10/07/2016 116 (H) 65 - 99 mg/dL Final    Patient Active Problem List   Diagnosis Date Noted  . Closed displaced fracture of phalanx of lesser toe of right foot 12/15/2018  . Toe  fracture, left 09/18/2018  . Panic attack 10/10/2016  . APC (atrial premature contractions) 05/27/2015  . History of varicose veins 05/27/2015  . Vitamin D deficiency 05/27/2015  . Chronic neck pain 05/27/2015  . Fibromyalgia 05/27/2015  . Tachycardia 02/05/2012  . Essential (primary) hypertension 11/04/2008    Past Surgical History:  Procedure Laterality Date  . CESAREAN SECTION  1993    Family History  Problem Relation Age of Onset  . Heart attack Father 58  . Hypertension Father   . Heart attack Brother 80       s/p stent   . Hyperlipidemia Brother   . Hypertension Brother   . Melanoma Mother   . Breast cancer Neg Hx     Social History   Socioeconomic History  . Marital status: Married    Spouse name: Not on file  . Number of children: 3  . Years of education: Not on file  . Highest education level: Associate degree: occupational, Hotel manager, or vocational program  Occupational History  . Occupation: Education officer, museum  Social Needs  . Financial resource strain: Not hard at all  . Food insecurity    Worry: Never true    Inability: Never true  . Transportation needs    Medical: No    Non-medical: No  Tobacco Use  . Smoking status: Never Smoker  . Smokeless tobacco: Never Used  Substance and Sexual Activity  . Alcohol use: No    Alcohol/week: 0.0 standard drinks  . Drug use: No  . Sexual activity: Yes    Partners: Male    Birth control/protection: Surgical  Lifestyle  . Physical activity    Days per week: 0 days    Minutes per session: 0 min  . Stress: Only a little  Relationships  . Social connections    Talks on phone: More than three times a week    Gets together: Twice a week    Attends religious service: More than 4 times per year    Active member of club or organization: Yes    Attends meetings of clubs or organizations: More than 4 times per year    Relationship status: Married  . Intimate partner violence    Fear of current or ex partner: No     Emotionally abused: No    Physically abused: No    Forced sexual activity: No  Other Topics Concern  . Not on file  Social History Narrative   Working from home     Current Outpatient Medications:  .  hydrochlorothiazide (MICROZIDE) 12.5 MG capsule, Take 1 capsule (12.5  mg total) by mouth daily., Disp: 90 capsule, Rfl: 1 .  Melatonin 10 MG CAPS, Take 1 capsule by mouth daily., Disp: 30 capsule, Rfl: 5 .  metoprolol succinate (TOPROL-XL) 25 MG 24 hr tablet, Take 1 tablet (25 mg total) by mouth daily., Disp: 90 tablet, Rfl: 1 .  Multiple Vitamin (MULTIVITAMIN) tablet, Take 1 tablet by mouth daily., Disp: , Rfl:  .  valACYclovir (VALTREX) 1000 MG tablet, TAKE 1 TABLET BY MOUTH THREE TIMES A DAY (Patient taking differently: Take 1,000 mg by mouth as needed. ), Disp: 270 tablet, Rfl: 1 .  hydrOXYzine (ATARAX/VISTARIL) 10 MG tablet, Take 1 tablet (10 mg total) by mouth 3 (three) times daily as needed. (Patient not taking: Reported on 01/06/2019), Disp: 30 tablet, Rfl: 0 .  triamcinolone cream (KENALOG) 0.1 %, Apply 1 application topically 2 (two) times daily. (Patient not taking: Reported on 01/06/2019), Disp: 30 g, Rfl: 0  No Known Allergies   ROS  Constitutional: Negative for fever or weight change.  Respiratory: Negative for cough and shortness of breath.   Cardiovascular: Negative for chest pain or palpitations.  Gastrointestinal: Negative for abdominal pain, no bowel changes.  Musculoskeletal: Negative for gait problem or joint swelling.  Skin: Negative for rash.  Neurological: Negative for dizziness or headache.  No other specific complaints in a complete review of systems (except as listed in HPI above).  Objective  Vitals:   01/06/19 1340  BP: 134/86  Pulse: 86  Resp: 16  Temp: (!) 97.1 F (36.2 C)  TempSrc: Temporal  SpO2: 99%  Weight: 145 lb 1.6 oz (65.8 kg)  Height: 5' 2.5" (1.588 m)    Body mass index is 26.12 kg/m.  Physical Exam  Constitutional: Patient  appears well-developed and well-nourished. No distress.  HENT: Head: Normocephalic and atraumatic. Ears: B TMs ok, no erythema or effusion; Nose: Nose normal. Mouth/Throat:not done Eyes: Conjunctivae and EOM are normal. Pupils are equal, round, and reactive to light. No scleral icterus.  Neck: Normal range of motion. Neck supple. No JVD present. No thyromegaly present.  Cardiovascular: Normal rate, regular rhythm and normal heart sounds.  No murmur heard. No BLE edema. Pulmonary/Chest: Effort normal and breath sounds normal. No respiratory distress. Abdominal: Soft. Bowel sounds are normal, no distension. There is no tenderness. no masses Breast: no lumps or masses, no nipple discharge or rashes FEMALE GENITALIA:  Not done RECTAL: not done Musculoskeletal: Normal range of motion, no joint effusions. No gross deformities Neurological: he is alert and oriented to person, place, and time. No cranial nerve deficit. Coordination, balance, strength, speech and gait are normal.  Skin: Skin is warm and dry. No rash noted. No erythema.  Psychiatric: Patient has a normal mood and affect. behavior is normal. Judgment and thought content normal.  Fall Risk: Fall Risk  01/06/2019 10/31/2018 09/16/2018 08/18/2018 06/27/2018  Falls in the past year? 0 0 0 0 0  Number falls in past yr: 0 0 0 - 0  Injury with Fall? 0 0 0 - 0    Assessment & Plan  1. Well adult exam  - MM 3D SCREEN BREAST BILATERAL; Future - Lipid panel - CBC with Differential/Platelet - COMPLETE METABOLIC PANEL WITH GFR - Hemoglobin A1c - Hepatitis C antibody - HIV Antibody (routine testing w rflx)  2. Colon cancer screening  - Ambulatory referral to Gastroenterology  3. Needs flu shot  refused  4. Encounter for screening mammogram for malignant neoplasm of breast  - MM 3D SCREEN BREAST BILATERAL; Future  5. Lipid screening  - Lipid panel  6. Essential (primary) hypertension  - CBC with Differential/Platelet - COMPLETE  METABOLIC PANEL WITH GFR  7. Diabetes mellitus screening  - Hemoglobin A1c  8. Need for hepatitis C screening test  - Hepatitis C antibody  9. Encounter for screening for HIV  - HIV Antibody (routine testing w rflx)   -USPSTF grade A and B recommendations reviewed with patient; age-appropriate recommendations, preventive care, screening tests, etc discussed and encouraged; healthy living encouraged; see AVS for patient education given to patient -Discussed importance of 150 minutes of physical activity weekly, eat two servings of fish weekly, eat one serving of tree nuts ( cashews, pistachios, pecans, almonds.Marland Kitchen) every other day, eat 6 servings of fruit/vegetables daily and drink plenty of water and avoid sweet beverages.

## 2019-01-06 NOTE — Patient Instructions (Signed)

## 2019-01-07 LAB — LIPID PANEL
Cholesterol: 189 mg/dL (ref <200)
HDL: 50 mg/dL (ref >=50)
LDL Cholesterol (Calc): 119 mg/dL (calc) — ABNORMAL HIGH
Non-HDL Cholesterol (Calc): 139 mg/dL (calc) — ABNORMAL HIGH (ref <130)
Total CHOL/HDL Ratio: 3.8 (calc) (ref <5.0)
Triglycerides: 95 mg/dL (ref <150)

## 2019-01-07 LAB — COMPLETE METABOLIC PANEL WITH GFR
AG Ratio: 1.7 (calc) (ref 1.0–2.5)
ALT: 14 U/L (ref 6–29)
AST: 19 U/L (ref 10–35)
Albumin: 4.4 g/dL (ref 3.6–5.1)
Alkaline phosphatase (APISO): 54 U/L (ref 37–153)
BUN: 14 mg/dL (ref 7–25)
CO2: 30 mmol/L (ref 20–32)
Calcium: 9.5 mg/dL (ref 8.6–10.4)
Chloride: 100 mmol/L (ref 98–110)
Creat: 0.88 mg/dL (ref 0.50–1.05)
GFR, Est African American: 89 mL/min/{1.73_m2} (ref >=60)
GFR, Est Non African American: 77 mL/min/{1.73_m2} (ref >=60)
Globulin: 2.6 g/dL (calc) (ref 1.9–3.7)
Glucose, Bld: 84 mg/dL (ref 65–99)
Potassium: 3.8 mmol/L (ref 3.5–5.3)
Sodium: 139 mmol/L (ref 135–146)
Total Bilirubin: 0.9 mg/dL (ref 0.2–1.2)
Total Protein: 7 g/dL (ref 6.1–8.1)

## 2019-01-07 LAB — CBC WITH DIFFERENTIAL/PLATELET
Absolute Monocytes: 570 cells/uL (ref 200–950)
Basophils Absolute: 37 cells/uL (ref 0–200)
Basophils Relative: 0.6 %
Eosinophils Absolute: 87 cells/uL (ref 15–500)
Eosinophils Relative: 1.4 %
HCT: 40.7 % (ref 35.0–45.0)
Hemoglobin: 13.6 g/dL (ref 11.7–15.5)
Lymphs Abs: 1717 cells/uL (ref 850–3900)
MCH: 33.1 pg — ABNORMAL HIGH (ref 27.0–33.0)
MCHC: 33.4 g/dL (ref 32.0–36.0)
MCV: 99 fL (ref 80.0–100.0)
MPV: 11.1 fL (ref 7.5–12.5)
Monocytes Relative: 9.2 %
Neutro Abs: 3788 cells/uL (ref 1500–7800)
Neutrophils Relative %: 61.1 %
Platelets: 251 10*3/uL (ref 140–400)
RBC: 4.11 10*6/uL (ref 3.80–5.10)
RDW: 12.2 % (ref 11.0–15.0)
Total Lymphocyte: 27.7 %
WBC: 6.2 10*3/uL (ref 3.8–10.8)

## 2019-01-07 LAB — HEPATITIS C ANTIBODY
Hepatitis C Ab: NONREACTIVE
SIGNAL TO CUT-OFF: 0.02 (ref <1.00)

## 2019-01-07 LAB — HIV ANTIBODY (ROUTINE TESTING W REFLEX): HIV 1&2 Ab, 4th Generation: NONREACTIVE

## 2019-01-07 LAB — HEMOGLOBIN A1C
Hgb A1c MFr Bld: 5 % of total Hgb (ref <5.7)
Mean Plasma Glucose: 97 (calc)
eAG (mmol/L): 5.4 (calc)

## 2019-01-22 ENCOUNTER — Encounter: Payer: Self-pay | Admitting: *Deleted

## 2019-02-28 ENCOUNTER — Other Ambulatory Visit: Payer: Self-pay | Admitting: Family Medicine

## 2019-02-28 DIAGNOSIS — R000 Tachycardia, unspecified: Secondary | ICD-10-CM

## 2019-02-28 DIAGNOSIS — I10 Essential (primary) hypertension: Secondary | ICD-10-CM

## 2019-03-10 ENCOUNTER — Telehealth: Payer: Self-pay

## 2019-03-10 NOTE — Telephone Encounter (Signed)
Contacted patient to schedule her colonoscopy.  She said she will need to call the office back to schedule because she did not have her work schedule readily available.  Thanks [employer]

## 2019-03-10 NOTE — Telephone Encounter (Signed)
-----   Message from Vanetta Mulders, [place] sent at 01/14/2019  9:41 AM EST ----- Regarding: Colonoscopy January 2021 Patient requested to call back in January to schedule her screening colonoscopy.  Thanks [employer]

## 2019-03-20 ENCOUNTER — Encounter: Payer: Self-pay | Admitting: *Deleted

## 2019-04-01 ENCOUNTER — Ambulatory Visit: Payer: BC Managed Care – PPO | Admitting: Family Medicine

## 2019-04-01 ENCOUNTER — Encounter: Payer: Self-pay | Admitting: Family Medicine

## 2019-04-01 ENCOUNTER — Other Ambulatory Visit: Payer: Self-pay

## 2019-04-01 VITALS — BP 116/80 | HR 91 | Temp 97.1°F | Resp 16 | Ht 62.5 in | Wt 141.1 lb

## 2019-04-01 DIAGNOSIS — Z8619 Personal history of other infectious and parasitic diseases: Secondary | ICD-10-CM

## 2019-04-01 DIAGNOSIS — R202 Paresthesia of skin: Secondary | ICD-10-CM | POA: Diagnosis not present

## 2019-04-01 DIAGNOSIS — I10 Essential (primary) hypertension: Secondary | ICD-10-CM | POA: Diagnosis not present

## 2019-04-01 MED ORDER — HYDROCHLOROTHIAZIDE 12.5 MG PO CAPS: 12.5000 mg | ORAL_CAPSULE | Freq: Every day | ORAL | 1 refills | Status: DC

## 2019-04-01 MED ORDER — PREGABALIN 50 MG PO CAPS: 50.0000 mg | ORAL_CAPSULE | Freq: Three times a day (TID) | ORAL | 0 refills | Status: DC

## 2019-04-01 MED ORDER — PREDNISONE 10 MG (21) PO TBPK: ORAL_TABLET | ORAL | 0 refills | Status: DC

## 2019-04-01 NOTE — Progress Notes (Signed)
Name: Erin [name]   MRN: [id number]    DOB: 23-Jan-1971   Date:04/01/2019       Progress Note  Subjective  Chief Complaint  Chief Complaint  Patient presents with  . Hypertension  . Back Pain    has been having pain in her back, neck and shoulders x 1 month that is constant. Pain gets worse at times but it is always there. She has been treating pain with Tylenol or Ibuprofen. She describes the pain as burning on her left shoulder and left upper back. Clothes sometimes irritate it or makes it very sensitive.  . Neck Pain  . Shoulder Pain    HPI  HTN: she is taking bp medication, denies chest pain or palpitation. No sob or decrease in exercise tolerance.   Left shoulder pain: she states she was walking her dog about one month ago and her dog took off running while on the leash and jerked her left shoulder. Initially felt like a sore muscle, but is getting progressively worse. Difficulty sleeping on left side, pain is now aching, but sometimes burning and radiates from left side of the neck to shoulder blade, no weakness and denies radiation to arm, but over the weekend radiated to right side. Normal rom of neck. She states skin very sensitive, she states she has a history of shingles and it was around the same area, but she has not seen any lesions at this time. She denies fatigue or fever.   Hematoma: she was cooking and noticed a bruise on right hand, going down now   Patient Active Problem List   Diagnosis Date Noted  . Closed displaced fracture of phalanx of lesser toe of right foot 12/15/2018  . Toe fracture, left 09/18/2018  . Panic attack 10/10/2016  . APC (atrial premature contractions) 05/27/2015  . History of varicose veins 05/27/2015  . Vitamin D deficiency 05/27/2015  . Chronic neck pain 05/27/2015  . Fibromyalgia 05/27/2015  . Tachycardia 02/05/2012  . Essential (primary) hypertension 11/04/2008    Past Surgical History:  Procedure Laterality Date  . CESAREAN  SECTION  1993    Family History  Problem Relation Age of Onset  . Heart attack Father 69  . Hypertension Father   . Heart attack Brother 34       s/p stent   . Hyperlipidemia Brother   . Hypertension Brother   . Melanoma Mother   . Breast cancer Neg Hx      Current Outpatient Medications:  .  hydrochlorothiazide (MICROZIDE) 12.5 MG capsule, Take 1 capsule (12.5 mg total) by mouth daily., Disp: 90 capsule, Rfl: 1 .  Melatonin 10 MG CAPS, Take 1 capsule by mouth daily., Disp: 30 capsule, Rfl: 5 .  metoprolol succinate (TOPROL-XL) 25 MG 24 hr tablet, TAKE 1 TABLET BY MOUTH EVERY DAY, Disp: 90 tablet, Rfl: 1 .  Multiple Vitamin (MULTIVITAMIN) tablet, Take 1 tablet by mouth daily., Disp: , Rfl:  .  triamcinolone cream (KENALOG) 0.1 %, Apply 1 application topically 2 (two) times daily., Disp: 30 g, Rfl: 0 .  hydrOXYzine (ATARAX/VISTARIL) 10 MG tablet, Take 1 tablet (10 mg total) by mouth 3 (three) times daily as needed. (Patient not taking: Reported on 01/06/2019), Disp: 30 tablet, Rfl: 0 .  predniSONE (STERAPRED UNI-PAK 21 TAB) 10 MG (21) TBPK tablet, Take as directed, Disp: 21 tablet, Rfl: 0 .  pregabalin (LYRICA) 50 MG capsule, Take 1 capsule (50 mg total) by mouth 3 (three) times daily., Disp: 90 capsule,  Rfl: 0  No Known Allergies  I personally reviewed active problem list, medication list, allergies, family history, social history with the patient/caregiver today.   ROS  Constitutional: Negative for fever or weight change.  Respiratory: Negative for cough and shortness of breath.   Cardiovascular: Negative for chest pain or palpitations.  Gastrointestinal: Negative for abdominal pain, no bowel changes.  Musculoskeletal: Negative for gait problem or joint swelling.  Skin: Negative for rash.  Neurological: Negative for dizziness or headache.  No other specific complaints in a complete review of systems (except as listed in HPI above).  Objective  Vitals:   04/01/19 0855   BP: 116/80  Pulse: 91  Resp: 16  Temp: (!) 97.1 F (36.2 C)  TempSrc: Temporal  SpO2: 98%  Weight: 141 lb 1.6 oz (64 kg)  Height: 5' 2.5" (1.588 m)    Body mass index is 25.4 kg/m.  Physical Exam  Constitutional: Patient appears well-developed and well-nourished.  No distress.  HEENT: head atraumatic, normocephalic, pupils equal and reactive to light Cardiovascular: Normal rate, regular rhythm and normal heart sounds.  No murmur heard. No BLE edema. Pulmonary/Chest: Effort normal and breath sounds normal. No respiratory distress. Abdominal: Soft.  There is no tenderness. Skin: small hematoma on right dorsal hand Muscular Skeletal: normal rom of left shoulder and neck, discomfort during palpation of left upper back  Psychiatric: Patient has a normal mood and affect. behavior is normal. Judgment and thought content normal.  Recent Results (from the past 2160 hour(s))  Lipid panel     Status: Abnormal   Collection Time: 01/06/19 12:00 AM  Result Value Ref Range   Cholesterol 189 <200 mg/dL   HDL 50 > OR = 50 mg/dL   Triglycerides 95 <150 mg/dL   LDL Cholesterol (Calc) 119 (H) mg/dL (calc)    Comment: Reference range: <100 . Desirable range <100 mg/dL for primary prevention;   <70 mg/dL for patients with CHD or diabetic patients  with > or = 2 CHD risk factors. Marland Kitchen LDL-C is now calculated using the Martin-Hopkins  calculation, which is a validated novel method providing  better accuracy than the Friedewald equation in the  estimation of LDL-C.  Cresenciano Genre et al. Annamaria Helling. [id number]): 2061-2068  (http://education.[facility].com/faq/FAQ164)    Total CHOL/HDL Ratio 3.8 <5.0 (calc)   Non-HDL Cholesterol (Calc) 139 (H) <130 mg/dL (calc)    Comment: For patients with diabetes plus 1 major ASCVD risk  factor, treating to a non-HDL-C goal of <100 mg/dL  (LDL-C of <70 mg/dL) is considered a therapeutic  option.   CBC with Differential/Platelet     Status: Abnormal    Collection Time: 01/06/19 12:00 AM  Result Value Ref Range   WBC 6.2 3.8 - 10.8 Thousand/uL   RBC 4.11 3.80 - 5.10 Million/uL   Hemoglobin 13.6 11.7 - 15.5 g/dL   HCT 40.7 35.0 - 45.0 %   MCV 99.0 80.0 - 100.0 fL   MCH 33.1 (H) 27.0 - 33.0 pg   MCHC 33.4 32.0 - 36.0 g/dL   RDW 12.2 11.0 - 15.0 %   Platelets 251 140 - 400 Thousand/uL   MPV 11.1 7.5 - 12.5 fL   Neutro Abs 3,788 1,500 - 7,800 cells/uL   Lymphs Abs 1,717 850 - 3,900 cells/uL   Absolute Monocytes 570 200 - 950 cells/uL   Eosinophils Absolute 87 15 - 500 cells/uL   Basophils Absolute 37 0 - 200 cells/uL   Neutrophils Relative % 61.1 %   Total Lymphocyte 27.7 %  Monocytes Relative 9.2 %   Eosinophils Relative 1.4 %   Basophils Relative 0.6 %  COMPLETE METABOLIC PANEL WITH GFR     Status: None   Collection Time: 01/06/19 12:00 AM  Result Value Ref Range   Glucose, Bld 84 65 - 99 mg/dL    Comment: .            Fasting reference interval .    BUN 14 7 - 25 mg/dL   Creat 0.88 0.50 - 1.05 mg/dL    Comment: For patients >72 years of age, the reference limit for Creatinine is approximately 13% higher for people identified as African-American. .    GFR, Est Non African American 77 > OR = 60 mL/min/1.3m2   GFR, Est African American 89 > OR = 60 mL/min/1.104m2   BUN/Creatinine Ratio NOT APPLICABLE 6 - 22 (calc)   Sodium 139 135 - 146 mmol/L   Potassium 3.8 3.5 - 5.3 mmol/L   Chloride 100 98 - 110 mmol/L   CO2 30 20 - 32 mmol/L   Calcium 9.5 8.6 - 10.4 mg/dL   Total Protein 7.0 6.1 - 8.1 g/dL   Albumin 4.4 3.6 - 5.1 g/dL   Globulin 2.6 1.9 - 3.7 g/dL (calc)   AG Ratio 1.7 1.0 - 2.5 (calc)   Total Bilirubin 0.9 0.2 - 1.2 mg/dL   Alkaline phosphatase (APISO) 54 37 - 153 U/L   AST 19 10 - 35 U/L   ALT 14 6 - 29 U/L  Hemoglobin A1c     Status: None   Collection Time: 01/06/19 12:00 AM  Result Value Ref Range   Hgb A1c MFr Bld 5.0 <5.7 % of total Hgb    Comment: For the purpose of screening for the presence  of diabetes: . <5.7%       Consistent with the absence of diabetes 5.7-6.4%    Consistent with increased risk for diabetes             (prediabetes) > or =6.5%  Consistent with diabetes . This assay result is consistent with a decreased risk of diabetes. . Currently, no consensus exists regarding use of hemoglobin A1c for diagnosis of diabetes in children. . According to American Diabetes Association (ADA) guidelines, hemoglobin A1c <7.0% represents optimal control in non-pregnant diabetic patients. Different metrics may apply to specific patient populations.  Standards of Medical Care in Diabetes(ADA). .    Mean Plasma Glucose 97 (calc)   eAG (mmol/L) 5.4 (calc)  Hepatitis C antibody     Status: None   Collection Time: 01/06/19 12:00 AM  Result Value Ref Range   Hepatitis C Ab NON-REACTIVE NON-REACTI   SIGNAL TO CUT-OFF 0.02 <1.00    Comment: . HCV antibody was non-reactive. There is no laboratory  evidence of HCV infection. . In most cases, no further action is required. However, if recent HCV exposure is suspected, a test for HCV RNA (test code [phone]) is suggested. . For additional information please refer to http://education.[facility].com/faq/FAQ22v1 (This link is being provided for informational/ educational purposes only.) .   HIV Antibody (routine testing w rflx)     Status: None   Collection Time: 01/06/19 12:00 AM  Result Value Ref Range   HIV 1&2 Ab, 4th Generation NON-REACTIVE NON-REACTI    Comment: HIV-1 antigen and HIV-1/HIV-2 antibodies were not detected. There is no laboratory evidence of HIV infection. Marland Kitchen PLEASE NOTE: This information has been disclosed to you from records whose confidentiality may be protected by state law.  If your state requires such protection, then the state law prohibits you from making any further disclosure of the information without the specific written consent of the person to whom it pertains, or as otherwise  permitted by law. A general authorization for the release of medical or other information is NOT sufficient for this purpose. . For additional information please refer to http://education.[facility].com/faq/FAQ106 (This link is being provided for informational/ educational purposes only.) . Marland Kitchen The performance of this assay has not been clinically validated in patients less than 43 years old. .     PHQ2/9: Depression screen [facility] 2/9 04/01/2019 01/06/2019 10/31/2018 09/16/2018 08/18/2018  Decreased Interest 0 1 0 0 0  Down, Depressed, Hopeless 0 0 0 0 0  PHQ - 2 Score 0 1 0 0 0  Altered sleeping 1 2 0 0 0  Tired, decreased energy 0 1 0 0 0  Change in appetite 0 0 0 0 0  Feeling bad or failure about yourself  0 0 0 0 0  Trouble concentrating 0 0 0 0 0  Moving slowly or fidgety/restless 0 0 0 0 0  Suicidal thoughts 0 0 0 0 0  PHQ-9 Score 1 4 0 0 0  Difficult doing work/chores Not difficult at all Not difficult at all - Not difficult at all Not difficult at all  Some recent data might be hidden    phq 9 is negative   Fall Risk: Fall Risk  04/01/2019 01/06/2019 10/31/2018 09/16/2018 08/18/2018  Falls in the past year? 0 0 0 0 0  Number falls in past yr: 0 0 0 0 -  Injury with Fall? 0 0 0 0 -     Functional Status Survey: Is the patient deaf or have difficulty hearing?: No Does the patient have difficulty seeing, even when wearing glasses/contacts?: No Does the patient have difficulty concentrating, remembering, or making decisions?: No Does the patient have difficulty walking or climbing stairs?: No Does the patient have difficulty dressing or bathing?: No Does the patient have difficulty doing errands alone such as visiting a doctor's office or shopping?: No   Assessment & Plan  1. Essential (primary) hypertension  - hydrochlorothiazide (MICROZIDE) 12.5 MG capsule; Take 1 capsule (12.5 mg total) by mouth daily.  Dispense: 90 capsule; Refill: 1  2. Paresthesia  Explained  it may be coming from her neck, she will contact me back if no improvement of symptoms, consider MRI neck - pregabalin (LYRICA) 50 MG capsule; Take 1 capsule (50 mg total) by mouth 3 (three) times daily.  Dispense: 90 capsule; Refill: 0 - predniSONE (STERAPRED UNI-PAK 21 TAB) 10 MG (21) TBPK tablet; Take as directed  Dispense: 21 tablet; Refill: 0  3. History of shingles  - pregabalin (LYRICA) 50 MG capsule; Take 1 capsule (50 mg total) by mouth 3 (three) times daily.  Dispense: 90 capsule; Refill: 0 - predniSONE (STERAPRED UNI-PAK 21 TAB) 10 MG (21) TBPK tablet; Take as directed  Dispense: 21 tablet; Refill: 0

## 2019-04-08 ENCOUNTER — Ambulatory Visit: Payer: BC Managed Care – PPO | Admitting: Family Medicine

## 2019-05-25 ENCOUNTER — Ambulatory Visit
Admission: RE | Admit: 2019-05-25 | Discharge: 2019-05-25 | Disposition: A | Discharge status: Home / Self Care | Payer: BC Managed Care – PPO | Source: Ambulatory Visit | Attending: Family Medicine | Admitting: Family Medicine

## 2019-05-25 DIAGNOSIS — Z1231 Encounter for screening mammogram for malignant neoplasm of breast: Secondary | ICD-10-CM | POA: Diagnosis not present

## 2019-05-25 DIAGNOSIS — Z0000 Encounter for general adult medical examination without abnormal findings: Secondary | ICD-10-CM

## 2019-06-19 DIAGNOSIS — H5210 Myopia, unspecified eye: Secondary | ICD-10-CM | POA: Diagnosis not present

## 2019-06-19 DIAGNOSIS — H5213 Myopia, bilateral: Secondary | ICD-10-CM | POA: Diagnosis not present

## 2019-08-27 ENCOUNTER — Other Ambulatory Visit: Payer: Self-pay | Admitting: Family Medicine

## 2019-08-27 DIAGNOSIS — I10 Essential (primary) hypertension: Secondary | ICD-10-CM

## 2019-08-27 DIAGNOSIS — R000 Tachycardia, unspecified: Secondary | ICD-10-CM

## 2019-11-30 ENCOUNTER — Other Ambulatory Visit: Payer: Self-pay | Admitting: Family Medicine

## 2019-11-30 DIAGNOSIS — I10 Essential (primary) hypertension: Secondary | ICD-10-CM

## 2019-11-30 DIAGNOSIS — R000 Tachycardia, unspecified: Secondary | ICD-10-CM

## 2019-12-23 DIAGNOSIS — Z20822 Contact with and (suspected) exposure to covid-19: Secondary | ICD-10-CM | POA: Diagnosis not present

## 2019-12-23 DIAGNOSIS — Z03818 Encounter for observation for suspected exposure to other biological agents ruled out: Secondary | ICD-10-CM | POA: Diagnosis not present

## 2019-12-30 DIAGNOSIS — Z20822 Contact with and (suspected) exposure to covid-19: Secondary | ICD-10-CM | POA: Diagnosis not present

## 2020-02-18 NOTE — Progress Notes (Signed)
Name: Erin [name]   MRN: [id number]    DOB: October 07, 1970   Date:02/19/2020       Progress Note  Subjective  Chief Complaint  Annual Exam  HPI  Patient presents for annual CPE and follow up  HTN: she has been  taking bp medication, denies chest pain or palpitation. No sob or decrease in exercise tolerance.  She states occasionally skips HCTZ but takes metoprolol daily , no side effects of medication   Post-herpetic neuralgia: episode of shingles a few years ago, continues to intermittent pain, not as severe as when she came in back in January, she never took Lyrica because of side effects. Discussed shingles vaccine.   Dyslipidemia: last LDL improved, she has been eating healthier meals, trying to avoid junk food We will recheck levels  The 10-year ASCVD risk score Mikey Bussing DC Jr., et al., 2013) is: 1.6%   Values used to calculate the score:     Age: 49 years     Sex: Female     Is Non-Hispanic African American: No     Diabetic: No     Tobacco smoker: No     Systolic Blood Pressure: 342 mmHg     Is BP treated: Yes     HDL Cholesterol: 50 mg/dL     Total Cholesterol: 189 mg/dL   Diet: discussed high calcium diet Exercise: she moves around but not really physically active, discussed 150 minutes per week   [place] Office Visit from 02/19/2020 in [facility]  AUDIT-C Score 0     Depression: Phq 9 is  negative Depression screen [facility] 2/9 02/19/2020 04/01/2019 01/06/2019 10/31/2018 09/16/2018  Decreased Interest 0 0 1 0 0  Down, Depressed, Hopeless 0 0 0 0 0  PHQ - 2 Score 0 0 1 0 0  Altered sleeping 0 1 2 0 0  Tired, decreased energy 1 0 1 0 0  Change in appetite 0 0 0 0 0  Feeling bad or failure about yourself  0 0 0 0 0  Trouble concentrating 0 0 0 0 0  Moving slowly or fidgety/restless 0 0 0 0 0  Suicidal thoughts 0 0 0 0 0  PHQ-9 Score 1 1 4  0 0  Difficult doing work/chores Not difficult at all Not difficult at all Not difficult at all - Not  difficult at all  Some recent data might be hidden   Hypertension: BP Readings from Last 3 Encounters:  02/19/20 116/76  04/01/19 116/80  01/06/19 134/86   Obesity: Wt Readings from Last 3 Encounters:  02/19/20 139 lb 9.6 oz (63.3 kg)  04/01/19 141 lb 1.6 oz (64 kg)  01/06/19 145 lb 1.6 oz (65.8 kg)   BMI Readings from Last 3 Encounters:  02/19/20 24.73 kg/m  04/01/19 25.40 kg/m  01/06/19 26.12 kg/m     Vaccines:   Shingrix: 57-64 yo and ask insurance if covered when patient above 66 yo Pneumonia: N/A educated and discussed with patient. Flu: educated and discussed with patient.  Hep C Screening: 01/06/2019 STD testing and prevention (HIV/chl/gon/syphilis): not interested  Intimate partner violence: negative screen  Sexual History : sexually active with husband, no pain or discomfort  Menstrual History/LMP/Abnormal Bleeding: cycles are regular , no hot flashes or night sweats  Incontinence Symptoms: very mild symptoms of stress incontinence   Breast cancer:  - Last Mammogram: 05/25/2019 - BRCA gene screening: N/A  Osteoporosis: Discussed high calcium and vitamin D supplementation, weight bearing exercises  Cervical cancer  screening: 05/30/2015  Skin cancer: Discussed monitoring for atypical lesions  Colorectal cancer: Ordered at today's visit  Lung cancer: Low Dose CT Chest recommended if Age 65-80 years, 20 pack-year currently smoking OR have quit w/in 15years. Patient does not qualify.   ECG: 10/07/2016  Advanced Care Planning: A voluntary discussion about advance care planning including the explanation and discussion of advance directives.  Discussed health care proxy and Living will, and the patient was able to identify a health care proxy as husband .  Patient does have a living will at present time.  Lipids: Lab Results  Component Value Date   CHOL 189 01/06/2019   CHOL 204 (H) 02/18/2018   CHOL 172 05/30/2017   Lab Results  Component Value Date    HDL 50 01/06/2019   HDL 51 02/18/2018   HDL 49 (L) 05/30/2017   Lab Results  Component Value Date   LDLCALC 119 (H) 01/06/2019   LDLCALC 131 (H) 02/18/2018   LDLCALC 109 (H) 05/30/2017   Lab Results  Component Value Date   TRIG 95 01/06/2019   TRIG 112 02/18/2018   TRIG 59 05/30/2017   Lab Results  Component Value Date   CHOLHDL 3.8 01/06/2019   CHOLHDL 4.0 02/18/2018   CHOLHDL 3.5 05/30/2017   No results found for: LDLDIRECT  Glucose: Glucose  Date Value Ref Range Status  02/02/2012 90 65 - 99 mg/dL Final   Glucose, Bld  Date Value Ref Range Status  01/06/2019 84 65 - 99 mg/dL Final    Comment:    .            Fasting reference interval .   02/18/2018 111 65 - 139 mg/dL Final    Comment:    .        Non-fasting reference interval .   05/30/2017 87 65 - 99 mg/dL Final    Comment:    .            Fasting reference interval .     Patient Active Problem List   Diagnosis Date Noted   Closed displaced fracture of phalanx of lesser toe of right foot 12/15/2018   Toe fracture, left 09/18/2018   Panic attack 10/10/2016   APC (atrial premature contractions) 05/27/2015   History of varicose veins 05/27/2015   Vitamin D deficiency 05/27/2015   Chronic neck pain 05/27/2015   Fibromyalgia 05/27/2015   Tachycardia 02/05/2012   Essential (primary) hypertension 11/04/2008    Past Surgical History:  Procedure Laterality Date   CESAREAN SECTION  1993    Family History  Problem Relation Age of Onset   Heart attack Father 58   Hypertension Father    Heart attack Brother 39       s/p stent    Hyperlipidemia Brother    Hypertension Brother    Melanoma Mother    Breast cancer Neg Hx     Social History   Socioeconomic History   Marital status: Married    Spouse name: Not on file   Number of children: 3   Years of education: Not on file   Highest education level: Associate degree: occupational, Hotel manager, or vocational program   Occupational History   Occupation: Education officer, museum  Tobacco Use   Smoking status: Never Smoker   Smokeless tobacco: Never Used  Scientific laboratory technician Use: Never used  Substance and Sexual Activity   Alcohol use: No    Alcohol/week: 0.0 standard drinks   Drug use: No  Sexual activity: Yes    Partners: Male    Birth control/protection: Surgical  Other Topics Concern   Not on file  Social History Narrative   Working from home   Social Determinants of Health   Financial Resource Strain: Low Risk    Difficulty of Paying Living Expenses: Not hard at all  Food Insecurity: No Food Insecurity   Worried About Charity fundraiser in the Last Year: Never true   Arboriculturist in the Last Year: Never true  Transportation Needs: No Transportation Needs   Lack of Transportation (Medical): No   Lack of Transportation (Non-Medical): No  Physical Activity: Inactive   Days of Exercise per Week: 0 days   Minutes of Exercise per Session: 0 min  Stress: No Stress Concern Present   Feeling of Stress : Only a little  Social Connections: Engineer, building services of Communication with Friends and Family: More than three times a week   Frequency of Social Gatherings with Friends and Family: More than three times a week   Attends Religious Services: More than 4 times per year   Active Member of [employer] or Organizations: Yes   Attends Music therapist: More than 4 times per year   Marital Status: Married  Human resources officer Violence: Not At Risk   Fear of Current or Ex-Partner: No   Emotionally Abused: No   Physically Abused: No   Sexually Abused: No     Current Outpatient Medications:    hydrochlorothiazide (MICROZIDE) 12.5 MG capsule, Take 1 capsule (12.5 mg total) by mouth daily., Disp: 90 capsule, Rfl: 1   Melatonin 10 MG CAPS, Take 1 capsule by mouth daily., Disp: 30 capsule, Rfl: 5   metoprolol succinate (TOPROL-XL) 25 MG 24 hr tablet, TAKE 1  TABLET BY MOUTH EVERY DAY, Disp: 90 tablet, Rfl: 0   Multiple Vitamin (MULTIVITAMIN) tablet, Take 1 tablet by mouth daily., Disp: , Rfl:    triamcinolone cream (KENALOG) 0.1 %, Apply 1 application topically 2 (two) times daily., Disp: 30 g, Rfl: 0  No Known Allergies   ROS  Constitutional: Negative for fever or weight change.  Respiratory: Negative for cough and shortness of breath.   Cardiovascular: Negative for chest pain or palpitations.  Gastrointestinal: Negative for abdominal pain, no bowel changes.  Musculoskeletal: Negative for gait problem or joint swelling.  Skin: Negative for rash.  Neurological: Negative for dizziness or headache.  No other specific complaints in a complete review of systems (except as listed in HPI above).  Objective  Vitals:   02/19/20 1028  BP: 116/76  Pulse: 69  Resp: 14  Temp: 98.1 F (36.7 C)  TempSrc: Oral  SpO2: 96%  Weight: 139 lb 9.6 oz (63.3 kg)  Height: 5' 3"  (1.6 m)    Body mass index is 24.73 kg/m.  Physical Exam  Constitutional: Patient appears well-developed and well-nourished. No distress.  HENT: Head: Normocephalic and atraumatic. Ears: B TMs ok, no erythema or effusion; Nose: Not done  Mouth/Throat: not done  Eyes: Conjunctivae and EOM are normal. Pupils are equal, round, and reactive to light. No scleral icterus.  Neck: Normal range of motion. Neck supple. No JVD present. No thyromegaly present.  Cardiovascular: Normal rate, regular rhythm and normal heart sounds.  No murmur heard. No BLE edema. Pulmonary/Chest: Effort normal and breath sounds normal. No respiratory distress. Abdominal: Soft. Bowel sounds are normal, no distension. There is no tenderness. no masses Breast: no lumps or masses, no nipple  discharge or rashes FEMALE GENITALIA:  External genitalia normal External urethra normal Vaginal vault normal without discharge or lesions Cervix normal without discharge or lesions Bimanual exam normal without  masses RECTAL: not done  Musculoskeletal: Normal range of motion, no joint effusions. No gross deformities Neurological: he is alert and oriented to person, place, and time. No cranial nerve deficit. Coordination, balance, strength, speech and gait are normal.  Skin: Skin is warm and dry. No rash noted. No erythema.  Psychiatric: Patient has a normal mood and affect. behavior is normal. Judgment and thought content normal.  Fall Risk: Fall Risk  02/19/2020 04/01/2019 01/06/2019 10/31/2018 09/16/2018  Falls in the past year? 0 0 0 0 0  Number falls in past yr: 0 0 0 0 0  Injury with Fall? 0 0 0 0 0     Assessment & Plan  1. Colon cancer screening  - Fecal Globin By Immunochemistry  2. Essential (primary) hypertension  - CBC with Differential/Platelet - COMPLETE METABOLIC PANEL WITH GFR - hydrochlorothiazide (MICROZIDE) 12.5 MG capsule; Take 1 capsule (12.5 mg total) by mouth daily.  Dispense: 90 capsule; Refill: 1 - metoprolol succinate (TOPROL-XL) 25 MG 24 hr tablet; Take 1 tablet (25 mg total) by mouth daily.  Dispense: 90 tablet; Refill: 1  3. Well adult exam   4. Diabetes mellitus screening  - Hemoglobin A1c  5. Dyslipidemia  - Lipid panel  6. Need for shingles vaccine   7. Needs flu shot  Today   8. Tachycardia  - metoprolol succinate (TOPROL-XL) 25 MG 24 hr tablet; Take 1 tablet (25 mg total) by mouth daily.  Dispense: 90 tablet; Refill: 1  9. Cervical cancer screening  - Cytology - PAP  10. Breast cancer screening by mammogram  - MM 3D SCREEN BREAST BILATERAL; Future  -USPSTF grade A and B recommendations reviewed with patient; age-appropriate recommendations, preventive care, screening tests, etc discussed and encouraged; healthy living encouraged; see AVS for patient education given to patient -Discussed importance of 150 minutes of physical activity weekly, eat two servings of fish weekly, eat one serving of tree nuts ( cashews, pistachios, pecans,  almonds.Marland Kitchen) every other day, eat 6 servings of fruit/vegetables daily and drink plenty of water and avoid sweet beverages.

## 2020-02-18 NOTE — Patient Instructions (Signed)

## 2020-02-19 ENCOUNTER — Encounter: Payer: Self-pay | Admitting: Family Medicine

## 2020-02-19 ENCOUNTER — Other Ambulatory Visit: Payer: Self-pay

## 2020-02-19 ENCOUNTER — Ambulatory Visit (INDEPENDENT_AMBULATORY_CARE_PROVIDER_SITE_OTHER): Payer: BC Managed Care – PPO | Admitting: Family Medicine

## 2020-02-19 ENCOUNTER — Other Ambulatory Visit (HOSPITAL_COMMUNITY)
Admission: RE | Admit: 2020-02-19 | Discharge: 2020-02-19 | Disposition: A | Discharge status: Home / Self Care | Payer: BC Managed Care – PPO | Source: Ambulatory Visit | Attending: Family Medicine | Admitting: Family Medicine

## 2020-02-19 VITALS — BP 116/76 | HR 69 | Temp 98.1°F | Resp 14 | Ht 63.0 in | Wt 139.6 lb

## 2020-02-19 DIAGNOSIS — E785 Hyperlipidemia, unspecified: Secondary | ICD-10-CM | POA: Diagnosis not present

## 2020-02-19 DIAGNOSIS — Z124 Encounter for screening for malignant neoplasm of cervix: Secondary | ICD-10-CM

## 2020-02-19 DIAGNOSIS — Z0000 Encounter for general adult medical examination without abnormal findings: Secondary | ICD-10-CM | POA: Diagnosis not present

## 2020-02-19 DIAGNOSIS — Z1231 Encounter for screening mammogram for malignant neoplasm of breast: Secondary | ICD-10-CM

## 2020-02-19 DIAGNOSIS — Z23 Encounter for immunization: Secondary | ICD-10-CM

## 2020-02-19 DIAGNOSIS — Z1211 Encounter for screening for malignant neoplasm of colon: Secondary | ICD-10-CM

## 2020-02-19 DIAGNOSIS — I10 Essential (primary) hypertension: Secondary | ICD-10-CM

## 2020-02-19 DIAGNOSIS — Z131 Encounter for screening for diabetes mellitus: Secondary | ICD-10-CM

## 2020-02-19 DIAGNOSIS — R000 Tachycardia, unspecified: Secondary | ICD-10-CM

## 2020-02-19 MED ORDER — HYDROCHLOROTHIAZIDE 12.5 MG PO CAPS: 12.5000 mg | ORAL_CAPSULE | Freq: Every day | ORAL | 1 refills | Status: DC

## 2020-02-19 MED ORDER — METOPROLOL SUCCINATE ER 25 MG PO TB24: 25.0000 mg | ORAL_TABLET | Freq: Every day | ORAL | 1 refills | Status: DC

## 2020-02-20 LAB — COMPLETE METABOLIC PANEL WITH GFR
AG Ratio: 1.7 (calc) (ref 1.0–2.5)
ALT: 12 U/L (ref 6–29)
AST: 16 U/L (ref 10–35)
Albumin: 4.4 g/dL (ref 3.6–5.1)
Alkaline phosphatase (APISO): 51 U/L (ref 37–153)
BUN: 11 mg/dL (ref 7–25)
CO2: 30 mmol/L (ref 20–32)
Calcium: 9.4 mg/dL (ref 8.6–10.4)
Chloride: 102 mmol/L (ref 98–110)
Creat: 0.88 mg/dL (ref 0.50–1.05)
GFR, Est African American: 88 mL/min/{1.73_m2} (ref >=60)
GFR, Est Non African American: 76 mL/min/{1.73_m2} (ref >=60)
Globulin: 2.6 g/dL (calc) (ref 1.9–3.7)
Glucose, Bld: 86 mg/dL (ref 65–99)
Potassium: 4.6 mmol/L (ref 3.5–5.3)
Sodium: 137 mmol/L (ref 135–146)
Total Bilirubin: 1.1 mg/dL (ref 0.2–1.2)
Total Protein: 7 g/dL (ref 6.1–8.1)

## 2020-02-20 LAB — HEMOGLOBIN A1C
Hgb A1c MFr Bld: 5.1 % of total Hgb (ref <5.7)
Mean Plasma Glucose: 100 mg/dL
eAG (mmol/L): 5.5 mmol/L

## 2020-02-20 LAB — CBC WITH DIFFERENTIAL/PLATELET
Absolute Monocytes: 691 cells/uL (ref 200–950)
Basophils Absolute: 49 cells/uL (ref 0–200)
Basophils Relative: 1 %
Eosinophils Absolute: 123 cells/uL (ref 15–500)
Eosinophils Relative: 2.5 %
HCT: 39.5 % (ref 35.0–45.0)
Hemoglobin: 13.6 g/dL (ref 11.7–15.5)
Lymphs Abs: 1514 cells/uL (ref 850–3900)
MCH: 34.1 pg — ABNORMAL HIGH (ref 27.0–33.0)
MCHC: 34.4 g/dL (ref 32.0–36.0)
MCV: 99 fL (ref 80.0–100.0)
MPV: 10.8 fL (ref 7.5–12.5)
Monocytes Relative: 14.1 %
Neutro Abs: 2524 cells/uL (ref 1500–7800)
Neutrophils Relative %: 51.5 %
Platelets: 258 10*3/uL (ref 140–400)
RBC: 3.99 10*6/uL (ref 3.80–5.10)
RDW: 11.8 % (ref 11.0–15.0)
Total Lymphocyte: 30.9 %
WBC: 4.9 10*3/uL (ref 3.8–10.8)

## 2020-02-20 LAB — LIPID PANEL
Cholesterol: 192 mg/dL (ref <200)
HDL: 58 mg/dL (ref >=50)
LDL Cholesterol (Calc): 119 mg/dL (calc) — ABNORMAL HIGH
Non-HDL Cholesterol (Calc): 134 mg/dL (calc) — ABNORMAL HIGH (ref <130)
Total CHOL/HDL Ratio: 3.3 (calc) (ref <5.0)
Triglycerides: 61 mg/dL (ref <150)

## 2020-02-23 LAB — CYTOLOGY - PAP
Comment: NEGATIVE
Diagnosis: NEGATIVE
High risk HPV: NEGATIVE

## 2020-04-19 DIAGNOSIS — Z03818 Encounter for observation for suspected exposure to other biological agents ruled out: Secondary | ICD-10-CM | POA: Diagnosis not present

## 2020-04-19 DIAGNOSIS — Z20822 Contact with and (suspected) exposure to covid-19: Secondary | ICD-10-CM | POA: Diagnosis not present

## 2020-05-10 DIAGNOSIS — Z20822 Contact with and (suspected) exposure to covid-19: Secondary | ICD-10-CM | POA: Diagnosis not present

## 2020-05-10 DIAGNOSIS — Z03818 Encounter for observation for suspected exposure to other biological agents ruled out: Secondary | ICD-10-CM | POA: Diagnosis not present

## 2020-05-16 ENCOUNTER — Other Ambulatory Visit: Payer: Self-pay | Admitting: Family Medicine

## 2020-05-16 ENCOUNTER — Encounter: Payer: Self-pay | Admitting: Family Medicine

## 2020-05-16 DIAGNOSIS — R600 Localized edema: Secondary | ICD-10-CM

## 2020-06-05 DIAGNOSIS — Z20822 Contact with and (suspected) exposure to covid-19: Secondary | ICD-10-CM | POA: Diagnosis not present

## 2020-06-08 ENCOUNTER — Other Ambulatory Visit: Payer: Self-pay

## 2020-06-08 ENCOUNTER — Ambulatory Visit (INDEPENDENT_AMBULATORY_CARE_PROVIDER_SITE_OTHER): Payer: BC Managed Care – PPO | Admitting: Nurse Practitioner

## 2020-06-08 ENCOUNTER — Encounter (INDEPENDENT_AMBULATORY_CARE_PROVIDER_SITE_OTHER): Payer: Self-pay | Admitting: Nurse Practitioner

## 2020-06-08 VITALS — BP 126/84 | HR 62 | Ht 63.0 in | Wt 143.0 lb

## 2020-06-08 DIAGNOSIS — I8311 Varicose veins of right lower extremity with inflammation: Secondary | ICD-10-CM

## 2020-06-08 DIAGNOSIS — I10 Essential (primary) hypertension: Secondary | ICD-10-CM

## 2020-06-08 DIAGNOSIS — I8312 Varicose veins of left lower extremity with inflammation: Secondary | ICD-10-CM

## 2020-06-13 NOTE — Progress Notes (Signed)
Subjective:    Patient ID: Erin [name], female    DOB: Nov 22, 1970, 50 y.o.   MRN: [id number] Chief Complaint  Patient presents with  . New Patient (Initial Visit)    Bil LE edema    Patient is seen for evaluation of leg swelling. The patient first noticed the swelling remotely but is now concerned because of a significant increase in the overall edema. The swelling is associated with pain and discoloration. The patient notes that in the morning the legs are significantly improved but they steadily worsened throughout the course of the day. Elevation makes the legs better, dependency makes them much worse.   There is no history of ulcerations associated with the swelling.   The patient denies any recent changes in their medications.  The patient has not been wearing graduated compression consistently.  The patient has no had any past angiography, interventions or vascular surgery.  The patient denies a history of DVT or PE. There is no prior history of phlebitis. There is no history of primary lymphedema.  There is no history of radiation treatment to the groin or pelvis No history of malignancies. No history of trauma or groin or pelvic surgery. No history of foreign travel or parasitic infections area    Review of Systems  Cardiovascular: Positive for leg swelling.  All other systems reviewed and are negative.      Objective:   Physical Exam Vitals reviewed.  Cardiovascular:     Rate and Rhythm: Normal rate.     Pulses: Normal pulses.  Pulmonary:     Effort: Pulmonary effort is normal.  Musculoskeletal:     Right lower leg: 2+ Edema present.     Left lower leg: 2+ Edema present.  Neurological:     Mental Status: She is alert and oriented to person, place, and time.  Psychiatric:        Mood and Affect: Mood normal.        Behavior: Behavior normal.        Thought Content: Thought content normal.        Judgment: Judgment normal.     BP 126/84   Pulse  62   Ht 5\' 3"  (1.6 m)   Wt 143 lb (64.9 kg)   BMI 25.33 kg/m   Past Medical History:  Diagnosis Date  . Hypertension   . Supraventricular premature beats   . Vein, varicose     Social History   Socioeconomic History  . Marital status: Married    Spouse name: Not on file  . Number of children: 3  . Years of education: Not on file  . Highest education level: Associate degree: occupational, Hotel manager, or vocational program  Occupational History  . Occupation: Education officer, museum  Tobacco Use  . Smoking status: Never Smoker  . Smokeless tobacco: Never Used  Vaping Use  . Vaping Use: Never used  Substance and Sexual Activity  . Alcohol use: No    Alcohol/week: 0.0 standard drinks  . Drug use: No  . Sexual activity: Yes    Partners: Male    Birth control/protection: Surgical  Other Topics Concern  . Not on file  Social History Narrative   Working from home   Social Determinants of Health   Financial Resource Strain: Low Risk   . Difficulty of Paying Living Expenses: Not hard at all  Food Insecurity: No Food Insecurity  . Worried About Charity fundraiser in the Last Year: Never true  . Ran  Out of Food in the Last Year: Never true  Transportation Needs: No Transportation Needs  . Lack of Transportation (Medical): No  . Lack of Transportation (Non-Medical): No  Physical Activity: Inactive  . Days of Exercise per Week: 0 days  . Minutes of Exercise per Session: 0 min  Stress: No Stress Concern Present  . Feeling of Stress : Only a little  Social Connections: Socially Integrated  . Frequency of Communication with Friends and Family: More than three times a week  . Frequency of Social Gatherings with Friends and Family: More than three times a week  . Attends Religious Services: More than 4 times per year  . Active Member of Clubs or Organizations: Yes  . Attends Archivist Meetings: More than 4 times per year  . Marital Status: Married  Human resources officer  Violence: Not At Risk  . Fear of Current or Ex-Partner: No  . Emotionally Abused: No  . Physically Abused: No  . Sexually Abused: No    Past Surgical History:  Procedure Laterality Date  . CESAREAN SECTION  1993    Family History  Problem Relation Age of Onset  . Heart attack Father 23  . Hypertension Father   . Heart attack Brother 41       s/p stent   . Hyperlipidemia Brother   . Hypertension Brother   . Melanoma Mother   . Breast cancer Neg Hx     No Known Allergies  CBC Latest Ref Rng & Units 02/19/2020 01/06/2019 02/18/2018  WBC 3.8 - 10.8 Thousand/uL 4.9 6.2 6.0  Hemoglobin 11.7 - 15.5 g/dL 13.6 13.6 14.1  Hematocrit 35.0 - 45.0 % 39.5 40.7 40.9  Platelets 140 - 400 Thousand/uL 258 251 247      CMP     Component Value Date/Time   NA 137 02/19/2020 1125   NA 138 02/02/2012 1024   K 4.6 02/19/2020 1125   K 3.4 (L) 02/02/2012 1024   CL 102 02/19/2020 1125   CL 104 02/02/2012 1024   CO2 30 02/19/2020 1125   CO2 28 02/02/2012 1024   GLUCOSE 86 02/19/2020 1125   GLUCOSE 90 02/02/2012 1024   BUN 11 02/19/2020 1125   BUN 9 02/02/2012 1024   CREATININE 0.88 02/19/2020 1125   CALCIUM 9.4 02/19/2020 1125   CALCIUM 8.9 02/02/2012 1024   PROT 7.0 02/19/2020 1125   PROT 7.6 02/02/2012 1024   ALBUMIN 4.3 02/02/2012 1024   AST 16 02/19/2020 1125   AST 22 02/02/2012 1024   ALT 12 02/19/2020 1125   ALT 20 02/02/2012 1024   ALKPHOS 62 02/02/2012 1024   BILITOT 1.1 02/19/2020 1125   BILITOT 0.9 02/02/2012 1024   GFRNONAA 76 02/19/2020 1125   GFRAA 88 02/19/2020 1125     No results found.     Assessment & Plan:   1. Varicose veins of both lower extremities with inflammation  Recommend:  The patient has large symptomatic varicose veins that are painful and associated with swelling that have previously    I have had a long discussion with the patient regarding  varicose veins and why they cause symptoms.  Patient will begin wearing graduated compression  stockings class 1 on a daily basis, beginning first thing in the morning and removing them in the evening. The patient is instructed specifically not to sleep in the stockings.    The patient  will also begin using over-the-counter analgesics such as Motrin 600 mg po TID to help control  the symptoms.    In addition, behavioral modification including elevation during the day will be initiated.    Pending the results of these changes the  patient will be reevaluated in three months.   An  ultrasound of the venous system will be obtained.   Further plans will be based on the ultrasound results and whether conservative therapies are successful at eliminating the pain and swelling.  - VAS [place] LOWER EXTREMITY VENOUS REFLUX; Future  2. Essential (primary) hypertension Continue antihypertensive medications as already ordered, these medications have been reviewed and there are no changes at this time.    Current Outpatient Medications on File Prior to Visit  Medication Sig Dispense Refill  . hydrochlorothiazide (MICROZIDE) 12.5 MG capsule Take 1 capsule (12.5 mg total) by mouth daily. 90 capsule 1  . Melatonin 10 MG CAPS Take 1 capsule by mouth daily. 30 capsule 5  . metoprolol succinate (TOPROL-XL) 25 MG 24 hr tablet Take 1 tablet (25 mg total) by mouth daily. 90 tablet 1  . Multiple Vitamin (MULTIVITAMIN) tablet Take 1 tablet by mouth daily.    Marland Kitchen triamcinolone cream (KENALOG) 0.1 % Apply 1 application topically 2 (two) times daily. 30 g 0   No current facility-administered medications on file prior to visit.    There are no Patient Instructions on file for this visit. No follow-ups on file.   Kris Hartmann, NP

## 2020-07-04 ENCOUNTER — Ambulatory Visit (INDEPENDENT_AMBULATORY_CARE_PROVIDER_SITE_OTHER): Payer: BC Managed Care – PPO | Admitting: Vascular Surgery

## 2020-07-04 ENCOUNTER — Other Ambulatory Visit: Payer: Self-pay

## 2020-07-04 ENCOUNTER — Ambulatory Visit (INDEPENDENT_AMBULATORY_CARE_PROVIDER_SITE_OTHER): Payer: BC Managed Care – PPO

## 2020-07-04 ENCOUNTER — Encounter (INDEPENDENT_AMBULATORY_CARE_PROVIDER_SITE_OTHER): Payer: Self-pay | Admitting: Vascular Surgery

## 2020-07-04 VITALS — BP 138/86 | HR 76 | Ht 63.0 in | Wt 147.0 lb

## 2020-07-04 DIAGNOSIS — I872 Venous insufficiency (chronic) (peripheral): Secondary | ICD-10-CM | POA: Diagnosis not present

## 2020-07-04 DIAGNOSIS — I10 Essential (primary) hypertension: Secondary | ICD-10-CM | POA: Diagnosis not present

## 2020-07-04 DIAGNOSIS — I8311 Varicose veins of right lower extremity with inflammation: Secondary | ICD-10-CM | POA: Diagnosis not present

## 2020-07-04 DIAGNOSIS — I83819 Varicose veins of unspecified lower extremities with pain: Secondary | ICD-10-CM

## 2020-07-04 DIAGNOSIS — I8312 Varicose veins of left lower extremity with inflammation: Secondary | ICD-10-CM | POA: Diagnosis not present

## 2020-07-04 DIAGNOSIS — I890 Lymphedema, not elsewhere classified: Secondary | ICD-10-CM | POA: Insufficient documentation

## 2020-07-04 NOTE — Progress Notes (Signed)
MRN : [id number]  Erin [name] is a 50 y.o. (1970/11/05) female who presents with chief complaint of No chief complaint on file. Marland Kitchen  History of Present Illness:   The patient returns for followup evaluation of large recurrent varicosities of the left lower extremity.  She noticed these over a year ago and in the past 12 months has identified significant increase in size as well as increase in pain and tenderness particularly with standing for prolonged periods of time.  The patient continues to have pain in the lower extremities with dependency. The pain is lessened with elevation. Graduated compression stockings, Class I (20-30 mmHg), have been worn but the stockings do not eliminate the leg pain. Over-the-counter analgesics do not improve the symptoms. The degree of discomfort continues to interfere with daily activities. The patient notes the pain in the legs is causing problems with daily exercise, at the workplace and even with household activities and maintenance such as standing in the kitchen preparing meals and doing dishes.   Venous ultrasound shows normal deep venous system, no evidence of acute or chronic DVT.  Superficial reflux is not present and her previous venous ablation remains intact   No outpatient medications have been marked as taking for the 07/04/20 encounter (Appointment) with Delana Meyer, Dolores Lory, MD.    Past Medical History:  Diagnosis Date  . Hypertension   . Supraventricular premature beats   . Vein, varicose     Past Surgical History:  Procedure Laterality Date  . CESAREAN SECTION  1993    Social History Social History   Tobacco Use  . Smoking status: Never Smoker  . Smokeless tobacco: Never Used  Vaping Use  . Vaping Use: Never used  Substance Use Topics  . Alcohol use: No    Alcohol/week: 0.0 standard drinks  . Drug use: No    Family History Family History  Problem Relation Age of Onset  . Heart attack Father 59  . Hypertension  Father   . Heart attack Brother 51       s/p stent   . Hyperlipidemia Brother   . Hypertension Brother   . Melanoma Mother   . Breast cancer Neg Hx     No Known Allergies   REVIEW OF SYSTEMS (Negative unless checked)  Constitutional: [] Weight loss  [] Fever  [] Chills Cardiac: [] Chest pain   [] Chest pressure   [] Palpitations   [] Shortness of breath when laying flat   [] Shortness of breath with exertion. Vascular:  [] Pain in legs with walking   [x] Pain in legs at rest  [] History of DVT   [] Phlebitis   [x] Swelling in legs   [x] Varicose veins   [] Non-healing ulcers Pulmonary:   [] Uses home oxygen   [] Productive cough   [] Hemoptysis   [] Wheeze  [] COPD   [] Asthma Neurologic:  [] Dizziness   [] Seizures   [] History of stroke   [] History of TIA  [] Aphasia   [] Vissual changes   [] Weakness or numbness in arm   [] Weakness or numbness in leg Musculoskeletal:   [] Joint swelling   [] Joint pain   [] Low back pain Hematologic:  [] Easy bruising  [] Easy bleeding   [] Hypercoagulable state   [] Anemic Gastrointestinal:  [] Diarrhea   [] Vomiting  [] Gastroesophageal reflux/heartburn   [] Difficulty swallowing. Genitourinary:  [] Chronic kidney disease   [] Difficult urination  [] Frequent urination   [] Blood in urine Skin:  [] Rashes   [] Ulcers  Psychological:  [] History of anxiety   []  History of major depression.  Physical Examination  There were  no vitals filed for this visit. There is no height or weight on file to calculate BMI. Gen: WD/WN, NAD Head: [place]/AT, No temporalis wasting.  Ear/Nose/Throat: Hearing grossly intact, nares w/o erythema or drainage Eyes: PER, EOMI, sclera nonicteric.  Neck: Supple, no large masses.   Pulmonary:  Good air movement, no audible wheezing bilaterally, no use of accessory muscles.  Cardiac: RRR, no JVD Vascular: Large varicosities present extensively greater than 10 mm left lower extremity there are 2 areas involved 1 is in the anterior thigh extending laterally toward the  lateral knee and the other is in the posterior calf area and the distal popliteal fossa..  Mild venous stasis changes to the legs bilaterally.  1+ soft pitting edema Vessel Right Left  Radial Palpable Palpable  Gastrointestinal: Non-distended. No guarding/no peritoneal signs.  Musculoskeletal: M/S 5/5 throughout.  No deformity or atrophy.  Neurologic: CN 2-12 intact. Symmetrical.  Speech is fluent. Motor exam as listed above. Psychiatric: Judgment intact, Mood & affect appropriate for pt's clinical situation. Dermatologic: Venous rashes no ulcers noted.  No changes consistent with cellulitis.  CBC Lab Results  Component Value Date   WBC 4.9 02/19/2020   HGB 13.6 02/19/2020   HCT 39.5 02/19/2020   MCV 99.0 02/19/2020   PLT 258 02/19/2020    BMET    Component Value Date/Time   NA 137 02/19/2020 1125   NA 138 02/02/2012 1024   K 4.6 02/19/2020 1125   K 3.4 (L) 02/02/2012 1024   CL 102 02/19/2020 1125   CL 104 02/02/2012 1024   CO2 30 02/19/2020 1125   CO2 28 02/02/2012 1024   GLUCOSE 86 02/19/2020 1125   GLUCOSE 90 02/02/2012 1024   BUN 11 02/19/2020 1125   BUN 9 02/02/2012 1024   CREATININE 0.88 02/19/2020 1125   CALCIUM 9.4 02/19/2020 1125   CALCIUM 8.9 02/02/2012 1024   GFRNONAA 76 02/19/2020 1125   GFRAA 88 02/19/2020 1125   CrCl cannot be calculated (Patient's most recent lab result is older than the maximum 21 days allowed.).  COAG No results found for: INR, PROTIME  Radiology No results found.   Assessment/Plan 1. Varicose veins with pain Recommend:  The patient has had successful ablation of the previously incompetent saphenous venous system but still has persistent symptoms of pain and swelling that are having a negative impact on daily life and daily activities.  Patient should undergo injection sclerotherapy to treat the residual varicosities.  The risks, benefits and alternative therapies were reviewed in detail with the patient.  All questions were  answered.  The patient agrees to proceed with sclerotherapy at their convenience.  The patient will continue wearing the graduated compression stockings and using the over-the-counter pain medications to treat her symptoms.     2. Chronic venous insufficiency No surgery or intervention at this point in time.    I have had a long discussion with the patient regarding venous insufficiency and why it  causes symptoms. I have discussed with the patient the chronic skin changes that accompany venous insufficiency and the long term sequela such as infection and ulceration.  Patient will begin wearing graduated compression stockings class 1 (20-30 mmHg) or compression wraps on a daily basis a prescription was given. The patient will put the stockings on first thing in the morning and removing them in the evening. The patient is instructed specifically not to sleep in the stockings.    In addition, behavioral modification including several periods of elevation of the lower  extremities during the day will be continued. I have demonstrated that proper elevation is a position with the ankles at heart level.  The patient is instructed to begin routine exercise, especially walking on a daily basis   3. Essential (primary) hypertension Continue antihypertensive medications as already ordered, these medications have been reviewed and there are no changes at this time.    Hortencia Pilar, MD  07/04/2020 3:06 PM

## 2020-07-05 ENCOUNTER — Encounter (INDEPENDENT_AMBULATORY_CARE_PROVIDER_SITE_OTHER): Payer: Self-pay | Admitting: Vascular Surgery

## 2020-07-05 DIAGNOSIS — I83819 Varicose veins of unspecified lower extremities with pain: Secondary | ICD-10-CM | POA: Insufficient documentation

## 2020-07-11 DIAGNOSIS — H524 Presbyopia: Secondary | ICD-10-CM | POA: Diagnosis not present

## 2020-08-04 ENCOUNTER — Other Ambulatory Visit: Payer: Self-pay

## 2020-08-04 ENCOUNTER — Ambulatory Visit (INDEPENDENT_AMBULATORY_CARE_PROVIDER_SITE_OTHER): Payer: BC Managed Care – PPO | Admitting: Vascular Surgery

## 2020-08-04 ENCOUNTER — Encounter (INDEPENDENT_AMBULATORY_CARE_PROVIDER_SITE_OTHER): Payer: Self-pay | Admitting: Vascular Surgery

## 2020-08-04 VITALS — BP 137/83 | HR 76 | Ht 63.0 in | Wt 146.0 lb

## 2020-08-04 DIAGNOSIS — I83812 Varicose veins of left lower extremities with pain: Secondary | ICD-10-CM

## 2020-08-04 DIAGNOSIS — I83811 Varicose veins of right lower extremities with pain: Secondary | ICD-10-CM

## 2020-08-04 DIAGNOSIS — I83819 Varicose veins of unspecified lower extremities with pain: Secondary | ICD-10-CM

## 2020-08-04 NOTE — Progress Notes (Signed)
   Indication:  Patient presents with symptomatic varicose veins of the bilateral lower extremity, primarily the left.  Area treat was the popliteal fossa  Procedure:  Sclerotherapy using both foam and hypertonic saline mixed with 1% Lidocaine was performed on the left lower extremity and saline on the right.  Compression wraps were placed.  The patient tolerated the procedure well.  Plan:  Follow up as needed.

## 2020-08-30 ENCOUNTER — Other Ambulatory Visit: Payer: Self-pay | Admitting: Family Medicine

## 2020-08-30 DIAGNOSIS — I10 Essential (primary) hypertension: Secondary | ICD-10-CM

## 2020-08-30 DIAGNOSIS — R000 Tachycardia, unspecified: Secondary | ICD-10-CM

## 2020-10-06 ENCOUNTER — Encounter: Payer: Self-pay | Admitting: Family Medicine

## 2020-10-06 ENCOUNTER — Telehealth (INDEPENDENT_AMBULATORY_CARE_PROVIDER_SITE_OTHER): Payer: BC Managed Care – PPO | Admitting: Family Medicine

## 2020-10-06 ENCOUNTER — Telehealth: Payer: BC Managed Care – PPO | Admitting: Unknown Physician Specialty

## 2020-10-06 DIAGNOSIS — Z8619 Personal history of other infectious and parasitic diseases: Secondary | ICD-10-CM

## 2020-10-06 DIAGNOSIS — M545 Low back pain, unspecified: Secondary | ICD-10-CM | POA: Diagnosis not present

## 2020-10-06 MED ORDER — METHYLPREDNISOLONE 4 MG PO TBPK: ORAL_TABLET | ORAL | 0 refills | Status: DC

## 2020-10-06 NOTE — Progress Notes (Signed)
Name: Erin [name]   MRN: [id number]    DOB: [birth year]   Date:10/06/2020       Progress Note  Subjective  Chief Complaint  Chief Complaint  Patient presents with   Herpes Zoster    I connected with  Nat Math on 10/06/20 at 10:00 AM EDT by telephone and verified that I am speaking with the correct person using two identifiers.   I discussed the limitations, risks, security and privacy concerns of performing an evaluation and management service by telephone and the availability of in person appointments. Staff also discussed with the patient that there may be a patient responsible charge related to this service. Patient Location: at work  Provider Location: [facility] Additional Individuals present: by herself   HPI  Shingles : she had episodes in the past, she states she had a flare with pain on the same spot, burning like, but never had the rash this time around. She tried otc medications Pain right now is in the mid lower back, described as burning, feels very sensitive when her clothes touches the area- feels raw . Denies bowel or bladder incontinence, denies dysuria, fever or chills. She states pain not triggered by movement . She also had prodrome like feeling tired prior to the weekend .   Patient Active Problem List   Diagnosis Date Noted   Varicose veins with pain 07/05/2020   Lymphedema 07/04/2020   Chronic venous insufficiency 07/04/2020   Closed displaced fracture of phalanx of lesser toe of right foot 12/15/2018   Toe fracture, left 09/18/2018   Panic attack 10/10/2016   APC (atrial premature contractions) 05/27/2015   History of varicose veins 05/27/2015   Vitamin D deficiency 05/27/2015   Chronic neck pain 05/27/2015   Fibromyalgia 05/27/2015   Tachycardia 02/05/2012   Essential (primary) hypertension 11/04/2008    Social History   Tobacco Use   Smoking status: Never   Smokeless tobacco: Never  Substance Use Topics   Alcohol use: No     Alcohol/week: 0.0 standard drinks     Current Outpatient Medications:    hydrochlorothiazide (MICROZIDE) 12.5 MG capsule, Take 1 capsule (12.5 mg total) by mouth daily., Disp: 90 capsule, Rfl: 1   Melatonin 10 MG CAPS, Take 1 capsule by mouth daily., Disp: 30 capsule, Rfl: 5   metoprolol succinate (TOPROL-XL) 25 MG 24 hr tablet, TAKE 1 TABLET (25 MG TOTAL) BY MOUTH DAILY., Disp: 90 tablet, Rfl: 1   Multiple Vitamin (MULTIVITAMIN) tablet, Take 1 tablet by mouth daily., Disp: , Rfl:    triamcinolone cream (KENALOG) 0.1 %, Apply 1 application topically 2 (two) times daily., Disp: 30 g, Rfl: 0  No Known Allergies  I personally reviewed active problem list, medication list, allergies, family history, social history, health maintenance with the patient/caregiver today.  ROS  Ten systems reviewed and is negative except as mentioned in HPI   Objective  Virtual encounter, vitals not obtained.  There is no height or weight on file to calculate BMI.  Nursing Note and Vital Signs reviewed.  Physical Exam  Awake, alert and oriented   Assessment & Plan  1. History of shingles  - methylPREDNISolone (MEDROL DOSEPAK) 4 MG TBPK tablet; Take as directed  Dispense: 21 tablet; Refill: 0  Explained it may not be shingles but radiculitis, we will try medrol dose pack since she responded in the past. Take with food and not right before bed time.  She can call back for Lyrica if continues to have  pain Advised regular follow up within the next month   2. Acute midline low back pain without sciatica  - methylPREDNISolone (MEDROL DOSEPAK) 4 MG TBPK tablet; Take as directed  Dispense: 21 tablet; Refill: 0   -Red flags and when to present for emergency care or RTC including fever >101.30F, chest pain, shortness of breath, new/worsening/un-resolving symptoms,  reviewed with patient at time of visit. Follow up and care instructions discussed and provided in AVS. - I discussed the assessment and  treatment plan with the patient. The patient was provided an opportunity to ask questions and all were answered. The patient agreed with the plan and demonstrated an understanding of the instructions.  - The patient was advised to call back or seek an in-person evaluation if the symptoms worsen or if the condition fails to improve as anticipated.  I provided 15 minutes of non-face-to-face time during this encounter.  Loistine Chance, MD

## 2020-10-14 ENCOUNTER — Encounter: Payer: Self-pay | Admitting: Family Medicine

## 2020-10-14 ENCOUNTER — Ambulatory Visit: Payer: BC Managed Care – PPO | Admitting: Family Medicine

## 2020-10-14 ENCOUNTER — Other Ambulatory Visit: Payer: Self-pay

## 2020-10-14 VITALS — BP 130/84 | HR 85 | Temp 98.5°F | Resp 16 | Ht 63.0 in | Wt 146.0 lb

## 2020-10-14 DIAGNOSIS — Z8619 Personal history of other infectious and parasitic diseases: Secondary | ICD-10-CM

## 2020-10-14 DIAGNOSIS — I10 Essential (primary) hypertension: Secondary | ICD-10-CM

## 2020-10-14 DIAGNOSIS — R000 Tachycardia, unspecified: Secondary | ICD-10-CM

## 2020-10-14 DIAGNOSIS — R208 Other disturbances of skin sensation: Secondary | ICD-10-CM

## 2020-10-14 DIAGNOSIS — M546 Pain in thoracic spine: Secondary | ICD-10-CM

## 2020-10-14 DIAGNOSIS — R202 Paresthesia of skin: Secondary | ICD-10-CM

## 2020-10-14 DIAGNOSIS — E785 Hyperlipidemia, unspecified: Secondary | ICD-10-CM | POA: Diagnosis not present

## 2020-10-14 DIAGNOSIS — M545 Low back pain, unspecified: Secondary | ICD-10-CM

## 2020-10-14 DIAGNOSIS — Z1211 Encounter for screening for malignant neoplasm of colon: Secondary | ICD-10-CM

## 2020-10-14 DIAGNOSIS — B0229 Other postherpetic nervous system involvement: Secondary | ICD-10-CM

## 2020-10-14 MED ORDER — PREGABALIN 50 MG PO CAPS: 50.0000 mg | ORAL_CAPSULE | Freq: Three times a day (TID) | ORAL | 0 refills | Status: DC

## 2020-10-14 MED ORDER — VALACYCLOVIR HCL 1 G PO TABS: 1000.0000 mg | ORAL_TABLET | Freq: Three times a day (TID) | ORAL | 0 refills | Status: AC

## 2020-10-14 NOTE — Progress Notes (Signed)
Name: Erin [name]   MRN: [id number]    DOB: 21-Aug-1970   Date:10/14/2020       Progress Note  Subjective  Chief Complaint  Follow Up  HPI  HTN: she has been  taking bp medication, denies chest pain ,SOB or decrease in exercise tolerance.  She has been compliant with medications HCTZ and metoprolol , no longer having palpitation    Dyslipidemia: last LDL improved, she has been eating healthier meals, trying to avoid junk food . Reviewed last labs   The 10-year ASCVD risk score Mikey Bussing DC Brooke Bonito., et al., 2013) is: 1.9%   Values used to calculate the score:     Age: 50 years     Sex: Female     Is Non-Hispanic African American: No     Diabetic: No     Tobacco smoker: No     Systolic Blood Pressure: [id number] mmHg     Is BP treated: Yes     HDL Cholesterol: 58 mg/dL     Total Cholesterol: 192 mg/dL   Shingles : she had episodes in the past, she states she has flares with pain on the same spot, burning like, but sometimes without a rash. She had a virtual last week because of increase in pain, hyperalgesia on the same spot , we tried Medrol dose pack and symptoms improved for about 24 hours but as soon as she started to taper . She states yesterday pain was a lot worse causing difficulty sitting up and had to go to bed early. She states she usually has flares with increase in stress, she works as a Education officer, museum for [employer] it may not be shingles, we will try adding Lyrica and do a 10 day course of valtrex and if no improvement she will go for X-ray    Patient Active Problem List   Diagnosis Date Noted   History of shingles 10/06/2020   Varicose veins with pain 07/05/2020   Lymphedema 07/04/2020   Chronic venous insufficiency 07/04/2020   Closed displaced fracture of phalanx of lesser toe of right foot 12/15/2018   Toe fracture, left 09/18/2018   Panic attack 10/10/2016   APC (atrial premature contractions) 05/27/2015   History of varicose veins 05/27/2015   Vitamin D deficiency  05/27/2015   Chronic neck pain 05/27/2015   Fibromyalgia 05/27/2015   Tachycardia 02/05/2012   Essential (primary) hypertension 11/04/2008    Past Surgical History:  Procedure Laterality Date   CESAREAN SECTION  1993    Family History  Problem Relation Age of Onset   Heart attack Father 35   Hypertension Father    Heart attack Brother 81       s/p stent    Hyperlipidemia Brother    Hypertension Brother    Melanoma Mother    Breast cancer Neg Hx     Social History   Tobacco Use   Smoking status: Never   Smokeless tobacco: Never  Substance Use Topics   Alcohol use: No    Alcohol/week: 0.0 standard drinks     Current Outpatient Medications:    hydrochlorothiazide (MICROZIDE) 12.5 MG capsule, Take 1 capsule (12.5 mg total) by mouth daily., Disp: 90 capsule, Rfl: 1   metoprolol succinate (TOPROL-XL) 25 MG 24 hr tablet, TAKE 1 TABLET (25 MG TOTAL) BY MOUTH DAILY., Disp: 90 tablet, Rfl: 1   Multiple Vitamin (MULTIVITAMIN) tablet, Take 1 tablet by mouth daily., Disp: , Rfl:    Melatonin 10 MG CAPS, Take 1 capsule  by mouth daily. (Patient not taking: Reported on 10/14/2020), Disp: 30 capsule, Rfl: 5   methylPREDNISolone (MEDROL DOSEPAK) 4 MG TBPK tablet, Take as directed (Patient not taking: Reported on 10/14/2020), Disp: 21 tablet, Rfl: 0   triamcinolone cream (KENALOG) 0.1 %, Apply 1 application topically 2 (two) times daily. (Patient not taking: Reported on 10/14/2020), Disp: 30 g, Rfl: 0  No Known Allergies  I personally reviewed active problem list, medication list, allergies, family history, social history, health maintenance with the patient/caregiver today.   ROS  Constitutional: Negative for fever or weight change.  Respiratory: Negative for cough and shortness of breath.   Cardiovascular: Negative for chest pain or palpitations.  Gastrointestinal: Negative for abdominal pain, no bowel changes.  Musculoskeletal: Negative for gait problem or joint swelling.  Skin:  Negative for rash.  Neurological: Negative for dizziness or headache.  No other specific complaints in a complete review of systems (except as listed in HPI above).   Objective  Vitals:   10/14/20 0934  BP: 130/84  Pulse: 85  Resp: 16  Temp: 98.5 F (36.9 C)  SpO2: 97%  Weight: 146 lb (66.2 kg)  Height: '5\' 3"'$  (1.6 m)    Body mass index is 25.86 kg/m.  Physical Exam  Constitutional: Patient appears well-developed and well-nourished.  No distress.  HEENT: head atraumatic, normocephalic, pupils equal and reactive to light, neck supple Cardiovascular: Normal rate, regular rhythm and normal heart sounds.  No murmur heard. No BLE edema. Pulmonary/Chest: Effort normal and breath sounds normal. No respiratory distress. Abdominal: Soft.  There is no tenderness. Skin : no rashes noticed Muscular Skeletal: pain during palpation of left scapular area and para spinal muscles on left mid thoracic area, normal shoulder exam, normal neck exam  Psychiatric: Patient has a normal mood and affect. behavior is normal. Judgment and thought content normal.   PHQ2/9: Depression screen [facility] 2/9 10/14/2020 02/19/2020 04/01/2019 01/06/2019 10/31/2018  Decreased Interest 0 0 0 1 0  Down, Depressed, Hopeless 0 0 0 0 0  PHQ - 2 Score 0 0 0 1 0  Altered sleeping - 0 1 2 0  Tired, decreased energy - 1 0 1 0  Change in appetite - 0 0 0 0  Feeling bad or failure about yourself  - 0 0 0 0  Trouble concentrating - 0 0 0 0  Moving slowly or fidgety/restless - 0 0 0 0  Suicidal thoughts - 0 0 0 0  PHQ-9 Score - '1 1 4 '$ 0  Difficult doing work/chores - Not difficult at all Not difficult at all Not difficult at all -  Some recent data might be hidden    phq 9 is negative   Fall Risk: Fall Risk  10/14/2020 02/19/2020 04/01/2019 01/06/2019 10/31/2018  Falls in the past year? 0 0 0 0 0  Number falls in past yr: 0 0 0 0 0  Injury with Fall? 0 0 0 0 0     Functional Status Survey: Is the patient deaf or have  difficulty hearing?: No Does the patient have difficulty seeing, even when wearing glasses/contacts?: No Does the patient have difficulty concentrating, remembering, or making decisions?: No Does the patient have difficulty walking or climbing stairs?: No Does the patient have difficulty dressing or bathing?: No Does the patient have difficulty doing errands alone such as visiting a doctor's office or shopping?: No    Assessment & Plan  1. Essential (primary) hypertension  At goal   2. Dyslipidemia   3.  Tachycardia  Controlled with beta blocker   4. History of shingles  - valACYclovir (VALTREX) 1000 MG tablet; Take 1 tablet (1,000 mg total) by mouth 3 (three) times daily for 10 days.  Dispense: 30 tablet; Refill: 0  5. Paresthesia  Likely post-herpetic neuralgia   6. Hyperalgesia   7. Acute left-sided thoracic back pain   8. Recurrent low back pain  - DG Thoracic Spine 2 View; Future  9. Post herpetic neuralgia  - pregabalin (LYRICA) 50 MG capsule; Take 1 capsule (50 mg total) by mouth 3 (three) times daily.  Dispense: 90 capsule; Refill: 0  10. Colon cancer screening  - Cologuard

## 2020-10-18 ENCOUNTER — Telehealth: Payer: BC Managed Care – PPO | Admitting: Family Medicine

## 2020-10-24 ENCOUNTER — Ambulatory Visit
Admission: RE | Admit: 2020-10-24 | Discharge: 2020-10-24 | Disposition: A | Discharge status: Home / Self Care | Payer: BC Managed Care – PPO | Attending: Family Medicine | Admitting: Family Medicine

## 2020-10-24 ENCOUNTER — Ambulatory Visit
Admission: RE | Admit: 2020-10-24 | Discharge: 2020-10-24 | Disposition: A | Discharge status: Home / Self Care | Payer: BC Managed Care – PPO | Source: Ambulatory Visit | Attending: Family Medicine | Admitting: Family Medicine

## 2020-10-24 ENCOUNTER — Encounter: Payer: Self-pay | Admitting: Family Medicine

## 2020-10-24 DIAGNOSIS — M545 Low back pain, unspecified: Secondary | ICD-10-CM

## 2020-10-24 DIAGNOSIS — M546 Pain in thoracic spine: Secondary | ICD-10-CM | POA: Diagnosis not present

## 2020-10-25 ENCOUNTER — Other Ambulatory Visit: Payer: Self-pay | Admitting: Family Medicine

## 2020-10-25 DIAGNOSIS — B0229 Other postherpetic nervous system involvement: Secondary | ICD-10-CM

## 2020-10-25 MED ORDER — PREGABALIN 75 MG PO CAPS: 75.0000 mg | ORAL_CAPSULE | Freq: Three times a day (TID) | ORAL | 0 refills | Status: DC

## 2020-11-08 ENCOUNTER — Ambulatory Visit: Payer: BC Managed Care – PPO | Admitting: Family Medicine

## 2020-11-09 DIAGNOSIS — M25571 Pain in right ankle and joints of right foot: Secondary | ICD-10-CM | POA: Diagnosis not present

## 2020-11-09 DIAGNOSIS — S82434A Nondisplaced oblique fracture of shaft of right fibula, initial encounter for closed fracture: Secondary | ICD-10-CM | POA: Diagnosis not present

## 2020-11-10 ENCOUNTER — Ambulatory Visit: Payer: BC Managed Care – PPO | Admitting: Unknown Physician Specialty

## 2020-11-10 DIAGNOSIS — S82831A Other fracture of upper and lower end of right fibula, initial encounter for closed fracture: Secondary | ICD-10-CM | POA: Diagnosis not present

## 2020-12-03 ENCOUNTER — Other Ambulatory Visit: Payer: Self-pay | Admitting: Family Medicine

## 2020-12-03 DIAGNOSIS — B0229 Other postherpetic nervous system involvement: Secondary | ICD-10-CM

## 2020-12-03 NOTE — Telephone Encounter (Signed)
Requested medication (s) are due for refill today: yes  Requested medication (s) are on the active medication list: yes  Last refill: 10/25/20  #90  0 refills  Future visit scheduled yes  02/22/21  Notes to clinic:  not delegated  Requested Prescriptions  Pending Prescriptions Disp Refills   pregabalin (LYRICA) 75 MG capsule [Pharmacy Med Name: PREGABALIN 75 MG CAPSULE] 90 capsule 0    Sig: TAKE 1 CAPSULE BY MOUTH 3 TIMES DAILY.     Not Delegated - Neurology:  Anticonvulsants - Controlled Failed - 12/03/2020 10:34 AM      Failed - This refill cannot be delegated      Passed - Valid encounter within last 12 months    Recent Outpatient Visits           1 month ago Essential (primary) hypertension   [facility] [place], Drue Stager, MD   1 month ago Acute midline low back pain without sciatica   [facility] Steele Sizer, MD   9 months ago Essential (primary) hypertension   [facility] Steele Sizer, MD   1 year ago Essential (primary) hypertension   [facility] Steele Sizer, MD   1 year ago Well adult exam   [facility] Steele Sizer, MD       Future Appointments             In 2 months Ancil Boozer, Drue Stager, MD [facility]

## 2020-12-07 DIAGNOSIS — S82831A Other fracture of upper and lower end of right fibula, initial encounter for closed fracture: Secondary | ICD-10-CM | POA: Diagnosis not present

## 2020-12-28 DIAGNOSIS — S82831A Other fracture of upper and lower end of right fibula, initial encounter for closed fracture: Secondary | ICD-10-CM | POA: Diagnosis not present

## 2020-12-30 ENCOUNTER — Other Ambulatory Visit: Payer: Self-pay | Admitting: Family Medicine

## 2020-12-30 DIAGNOSIS — I10 Essential (primary) hypertension: Secondary | ICD-10-CM

## 2020-12-30 DIAGNOSIS — R000 Tachycardia, unspecified: Secondary | ICD-10-CM

## 2020-12-30 NOTE — Telephone Encounter (Signed)
Pt called in to request a refill for metoprolol succinate (TOPROL-XL) 25 MG 24 hr tablet   Pt says that she has been trying to get through to her pharmacy but is unable to.    Pharmacy: [id number] - [place], [place] - [address]  [address] [place], [place] [address]

## 2020-12-31 MED ORDER — METOPROLOL SUCCINATE ER 25 MG PO TB24: 25.0000 mg | ORAL_TABLET | Freq: Every day | ORAL | 1 refills | Status: DC

## 2021-01-17 IMAGING — MG DIGITAL SCREENING BILAT W/ TOMO W/ CAD
6 of 10 series · 6 of 30 positions shown · non-contrast
Comparison: Previous exam(s).

CLINICAL DATA: Screening.

EXAM:
DIGITAL SCREENING BILATERAL MAMMOGRAM WITH TOMO AND CAD

[L MLO synth-2D]
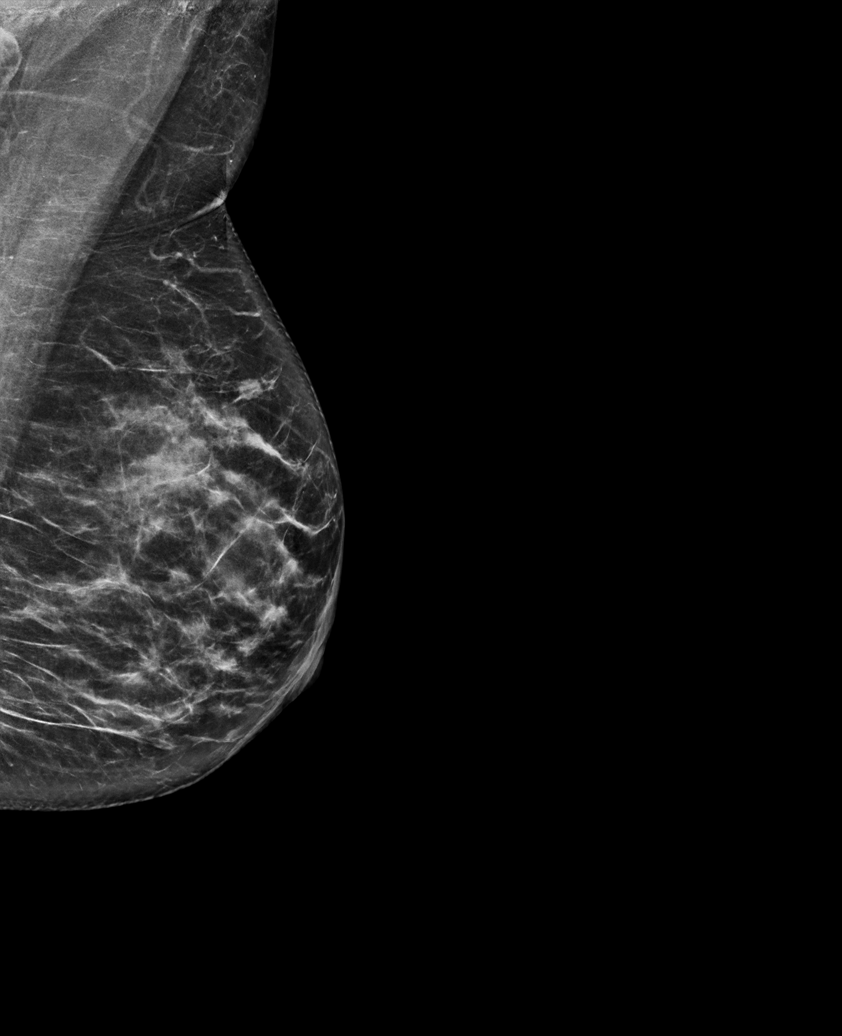

[R MLO synth-2D (1 of 2)]
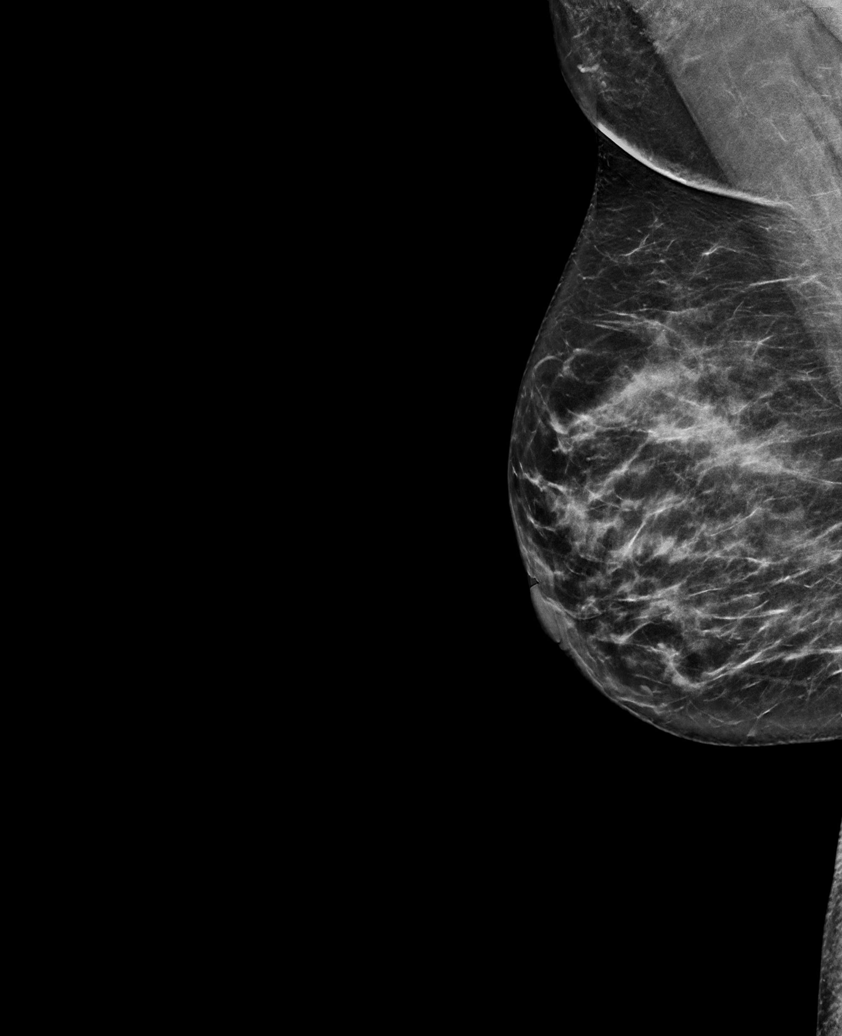

[L CC synth-2D]
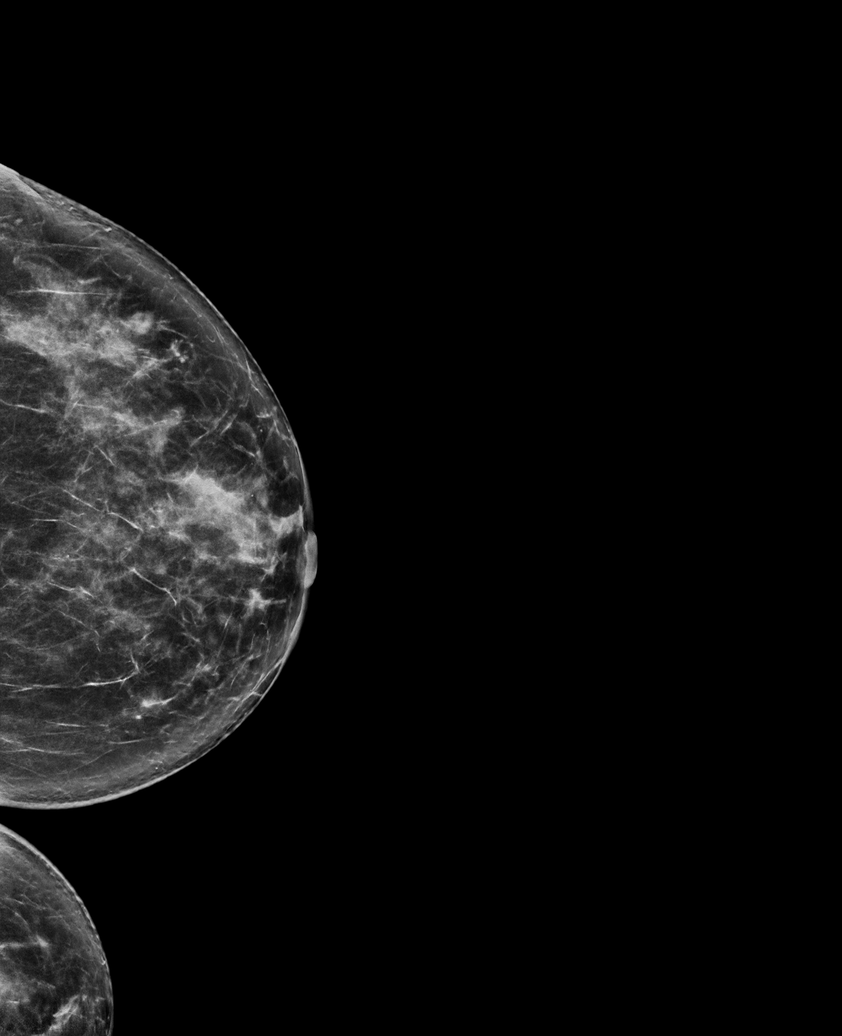

[R MLO synth-2D (2 of 2)]
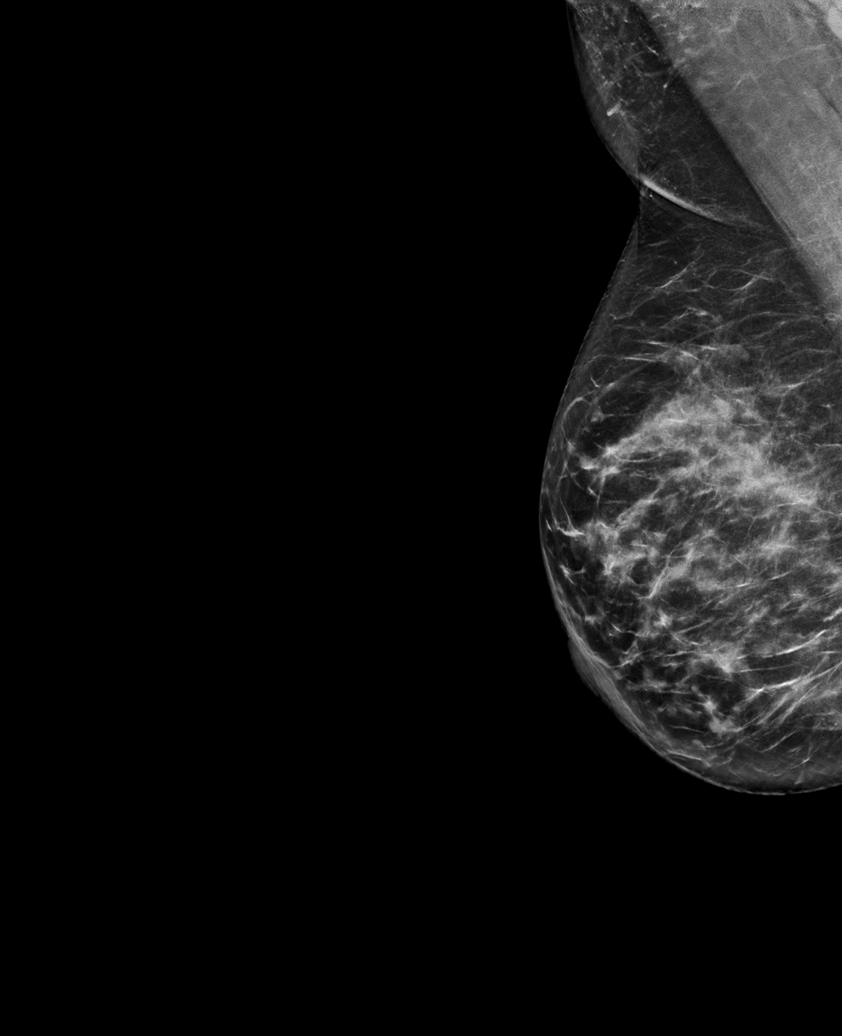

[R CC synth-2D]
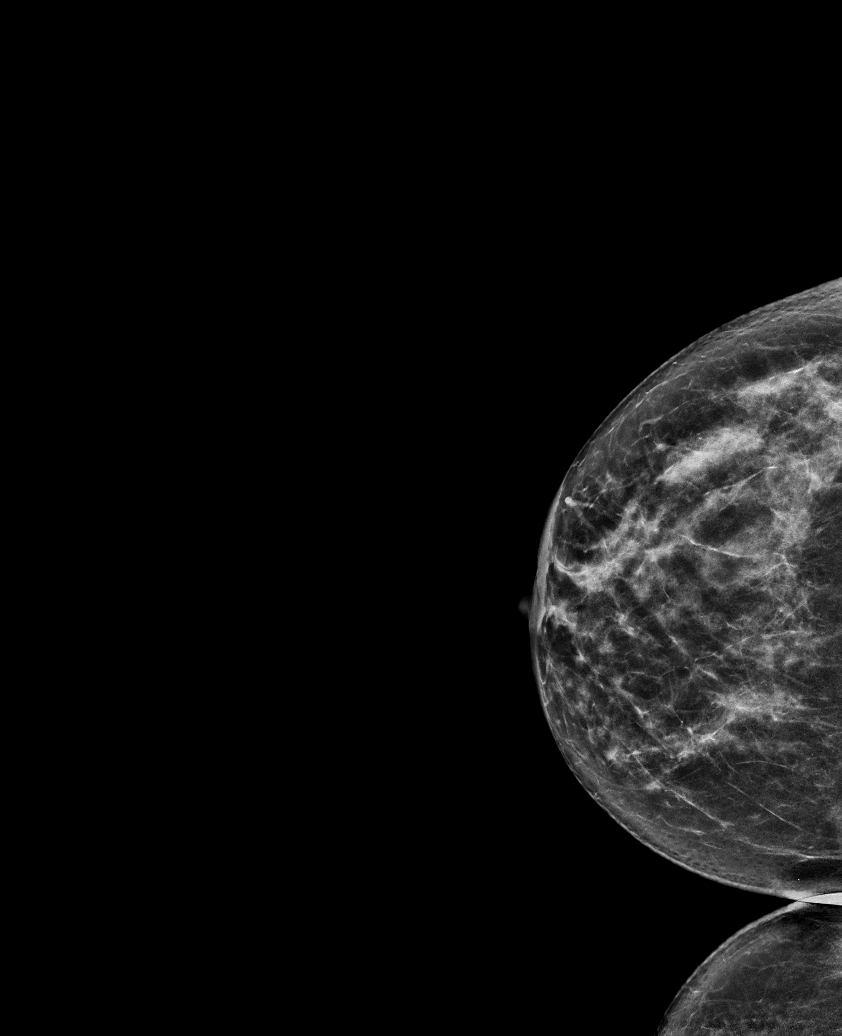

[R CC tomo · tomo slice 32/63.0]
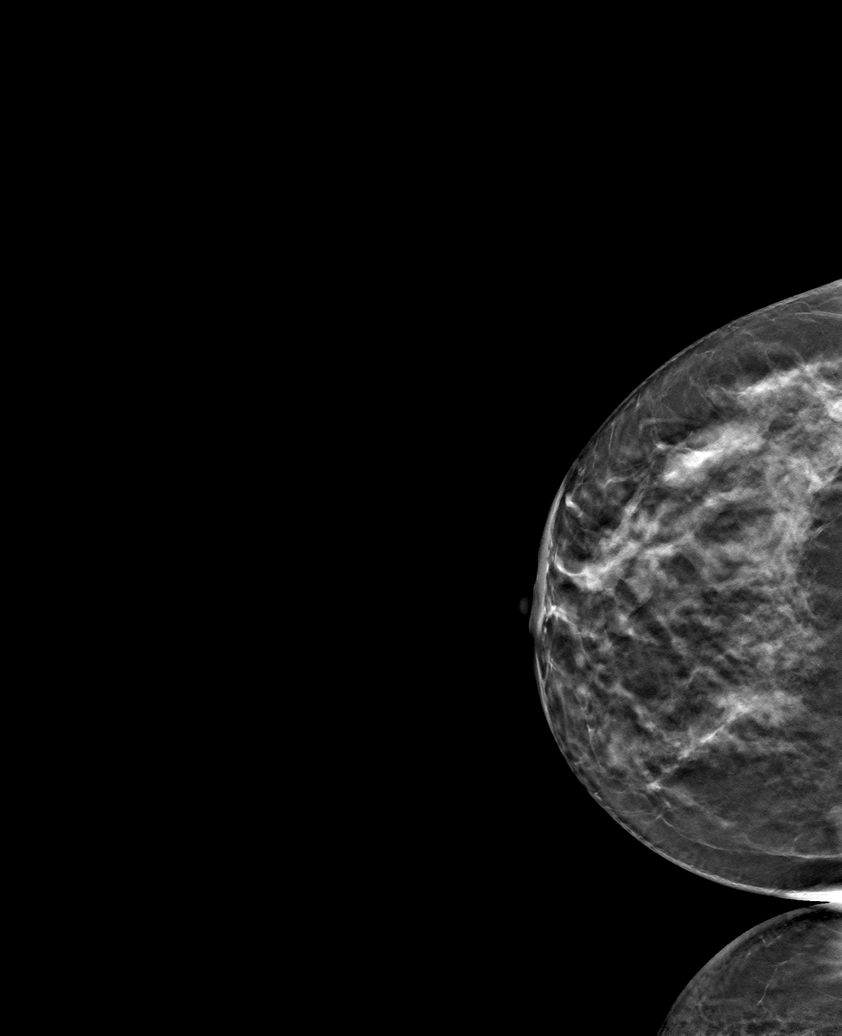

[6 of 30 positions shown; findings below may reference images not displayed]

ACR Breast Density Category c: The breast tissue is heterogeneously
dense, which may obscure small masses.
FINDINGS: There are no findings suspicious for malignancy. Images were
processed with CAD.
IMPRESSION: No mammographic evidence of malignancy. A result letter of this
screening mammogram will be mailed directly to the patient.

RECOMMENDATION:
Screening mammogram in one year. (Code:[id number])

BI-RADS CATEGORY  1: Negative.

## 2021-02-22 ENCOUNTER — Encounter: Payer: BC Managed Care – PPO | Admitting: Family Medicine

## 2021-03-06 DIAGNOSIS — Z20822 Contact with and (suspected) exposure to covid-19: Secondary | ICD-10-CM | POA: Diagnosis not present

## 2021-03-28 ENCOUNTER — Other Ambulatory Visit: Payer: Self-pay | Admitting: Family Medicine

## 2021-03-28 DIAGNOSIS — I10 Essential (primary) hypertension: Secondary | ICD-10-CM

## 2021-04-09 ENCOUNTER — Other Ambulatory Visit: Payer: Self-pay | Admitting: Family Medicine

## 2021-04-09 DIAGNOSIS — B0229 Other postherpetic nervous system involvement: Secondary | ICD-10-CM

## 2021-04-11 MED ORDER — PREGABALIN 75 MG PO CAPS: 75.0000 mg | ORAL_CAPSULE | Freq: Three times a day (TID) | ORAL | 0 refills | Status: DC

## 2021-05-03 DIAGNOSIS — S82831A Other fracture of upper and lower end of right fibula, initial encounter for closed fracture: Secondary | ICD-10-CM | POA: Diagnosis not present

## 2021-05-19 ENCOUNTER — Telehealth (INDEPENDENT_AMBULATORY_CARE_PROVIDER_SITE_OTHER): Payer: Self-pay | Admitting: Nurse Practitioner

## 2021-05-19 NOTE — Telephone Encounter (Signed)
LVM for pt @ [phone]. Need to schedule appt for pt when calls back. ? ?Per phone note - bring in with abi + reflux. See gs/fb ?

## 2021-05-19 NOTE — Telephone Encounter (Signed)
She can come in with ABIs and reflux to see GS/ FB

## 2021-05-19 NOTE — Telephone Encounter (Signed)
Pt called to make an appt - she is having Pain and cramping in right leg especially at night. Please advise.  ?

## 2021-05-24 ENCOUNTER — Encounter: Payer: Self-pay | Admitting: Family Medicine

## 2021-05-24 ENCOUNTER — Ambulatory Visit (INDEPENDENT_AMBULATORY_CARE_PROVIDER_SITE_OTHER): Payer: BC Managed Care – PPO | Admitting: Family Medicine

## 2021-05-24 VITALS — BP 112/70 | HR 91 | Resp 16 | Ht 63.0 in | Wt 151.0 lb

## 2021-05-24 DIAGNOSIS — Z0000 Encounter for general adult medical examination without abnormal findings: Secondary | ICD-10-CM

## 2021-05-24 DIAGNOSIS — Z23 Encounter for immunization: Secondary | ICD-10-CM

## 2021-05-24 DIAGNOSIS — Z1231 Encounter for screening mammogram for malignant neoplasm of breast: Secondary | ICD-10-CM | POA: Diagnosis not present

## 2021-05-24 DIAGNOSIS — Z79899 Other long term (current) drug therapy: Secondary | ICD-10-CM | POA: Diagnosis not present

## 2021-05-24 DIAGNOSIS — E785 Hyperlipidemia, unspecified: Secondary | ICD-10-CM | POA: Diagnosis not present

## 2021-05-24 DIAGNOSIS — Z1211 Encounter for screening for malignant neoplasm of colon: Secondary | ICD-10-CM | POA: Diagnosis not present

## 2021-05-24 MED ORDER — SHINGRIX 50 MCG/0.5ML IM SUSR: 0.5000 mL | Freq: Once | INTRAMUSCULAR | 1 refills | Status: AC

## 2021-05-24 NOTE — Progress Notes (Signed)
Name: Erin [name]   MRN: [id number]    DOB: 04-03-1970   Date:05/24/2021 ? ?     Progress Note ? ?Subjective ? ?Chief Complaint ? ?Annual Exam ? ?HPI ? ?Patient presents for annual CPE. ? ?Diet: balanced  ?Exercise: not lately due to right ankle fracture and has not been active yet, happened during a fall  09/22  ? ?[place] Office Visit from 02/19/2020 in [facility]  ?AUDIT-C Score 0  ? ?  ? ?Depression: Phq 9 is  negative ?Depression screen [facility] 2/9 05/24/2021 10/14/2020 02/19/2020 04/01/2019 01/06/2019  ?Decreased Interest 0 0 0 0 1  ?Down, Depressed, Hopeless 0 0 0 0 0  ?PHQ - 2 Score 0 0 0 0 1  ?Altered sleeping 1 - 0 1 2  ?Tired, decreased energy 1 - 1 0 1  ?Change in appetite 0 - 0 0 0  ?Feeling bad or failure about yourself  0 - 0 0 0  ?Trouble concentrating 0 - 0 0 0  ?Moving slowly or fidgety/restless 0 - 0 0 0  ?Suicidal thoughts 0 - 0 0 0  ?PHQ-9 Score 2 - _0 ?Difficult doing work/chores - - Not difficult at all Not difficult at all Not difficult at all  ?Some recent data might be hidden  ? ?Hypertension: ?BP Readings from Last 3 Encounters:  ?05/24/21 112/70  ?10/14/20 130/84  ?08/04/20 137/83  ? ?Obesity: ?Wt Readings from Last 3 Encounters:  ?05/24/21 151 lb (68.5 kg)  ?10/14/20 146 lb (66.2 kg)  ?08/04/20 146 lb (66.2 kg)  ? ?BMI Readings from Last 3 Encounters:  ?05/24/21 26.75 kg/m?  ?10/14/20 25.86 kg/m?  ?08/04/20 25.86 kg/m?  ?  ? ?Vaccines:  ? ?HPV: N/A ?Tdap: up to date ?Shingrix: she will go to local pharmacy  ?Pneumonia: N/A ?Flu: 02/19/20, not interested  ?COVID-19: discussed booster  ? ? ?Hep C Screening: 01/06/19 ?STD testing and prevention (HIV/chl/gon/syphilis): 01/06/19 ?Intimate partner violence: negative ?Sexual History : no pain or discomfort  ?Menstrual History/LMP/Abnormal Bleeding: skipping cycles, LMP around Nov, advised importance of writing cycles down  ?Discussed importance of follow up if any post-menopausal bleeding: not applicable ?Incontinence  Symptoms: Yes.  Mild symptoms, discussed kegel exercises  ? ?Breast cancer:  ?- Last Mammogram: 05/25/19 ?- BRCA gene screening: N/A ? ?Osteoporosis Prevention : Discussed high calcium and vitamin D supplementation, weight bearing exercises ?Bone density :not applicable ? ?Cervical cancer screening: 02/19/20 ? ?Skin cancer: Discussed monitoring for atypical lesions - she has an appointment with dermatologist scheduled  ?Colorectal cancer:she is willing to have colonoscopy    ?Lung cancer:  Low Dose CT Chest recommended if Age 49-80 years, 20 pack-year currently smoking OR have quit w/in 15years. Patient does not qualify.   ?ECG: 10/07/16 ? ?Advanced Care Planning: A voluntary discussion about advance care planning including the explanation and discussion of advance directives.  Discussed health care proxy and Living will, and the patient was able to identify a health care proxy as husband .  Patient does not have a living will or power of attorney of health care  ? ?Lipids: ?Lab Results  ?Component Value Date  ? CHOL 192 02/19/2020  ? CHOL 189 01/06/2019  ? CHOL 204 (H) 02/18/2018  ? ?Lab Results  ?Component Value Date  ? HDL 58 02/19/2020  ? HDL 50 01/06/2019  ? HDL 51 02/18/2018  ? ?Lab Results  ?Component Value Date  ? LDLCALC 119 (H) 02/19/2020  ? LDLCALC 119 (H) 01/06/2019  ?  LDLCALC 131 (H) 02/18/2018  ? ?Lab Results  ?Component Value Date  ? TRIG 61 02/19/2020  ? TRIG 95 01/06/2019  ? TRIG 112 02/18/2018  ? ?Lab Results  ?Component Value Date  ? CHOLHDL 3.3 02/19/2020  ? CHOLHDL 3.8 01/06/2019  ? CHOLHDL 4.0 02/18/2018  ? ?No results found for: LDLDIRECT ? ?Glucose: ?Glucose  ?Date Value Ref Range Status  ?02/02/2012 90 65 - 99 mg/dL Final  ? ?Glucose, Bld  ?Date Value Ref Range Status  ?02/19/2020 86 65 - 99 mg/dL Final  ?  Comment:  ?  . ?           Fasting reference interval ?. ?  ?01/06/2019 84 65 - 99 mg/dL Final  ?  Comment:  ?  . ?           Fasting reference interval ?. ?  ?02/18/2018 111 65 - 139 mg/dL  Final  ?  Comment:  ?  . ?       Non-fasting reference interval ?. ?  ? ? ?Patient Active Problem List  ? Diagnosis Date Noted  ? History of shingles 10/06/2020  ? Varicose veins with pain 07/05/2020  ? Lymphedema 07/04/2020  ? Chronic venous insufficiency 07/04/2020  ? Closed displaced fracture of phalanx of lesser toe of right foot 12/15/2018  ? Toe fracture, left 09/18/2018  ? Panic attack 10/10/2016  ? APC (atrial premature contractions) 05/27/2015  ? History of varicose veins 05/27/2015  ? Vitamin D deficiency 05/27/2015  ? Chronic neck pain 05/27/2015  ? Fibromyalgia 05/27/2015  ? Tachycardia 02/05/2012  ? Essential (primary) hypertension 11/04/2008  ? ? ?Past Surgical History:  ?Procedure Laterality Date  ? [place]  ? ? ?Family History  ?Problem Relation Age of Onset  ? Heart attack Father 17  ? Hypertension Father   ? Heart attack Brother 29  ?     s/p stent   ? Hyperlipidemia Brother   ? Hypertension Brother   ? Melanoma Mother   ? Breast cancer Neg Hx   ? ? ?Social History  ? ?Socioeconomic History  ? Marital status: Married  ?  Spouse name: Not on file  ? Number of children: 3  ? Years of education: Not on file  ? Highest education level: Associate degree: occupational, Hotel manager, or vocational program  ?Occupational History  ? Occupation: Education officer, museum  ?Tobacco Use  ? Smoking status: Never  ? Smokeless tobacco: Never  ?Vaping Use  ? Vaping Use: Never used  ?Substance and Sexual Activity  ? Alcohol use: No  ?  Alcohol/week: 0.0 standard drinks  ? Drug use: No  ? Sexual activity: Yes  ?  Partners: Male  ?  Birth control/protection: Surgical  ?Other Topics Concern  ? Not on file  ?Social History Narrative  ? Working from home  ? ?Social Determinants of Health  ? ?Financial Resource Strain: Low Risk   ? Difficulty of Paying Living Expenses: Not hard at all  ?Food Insecurity: No Food Insecurity  ? Worried About Charity fundraiser in the Last Year: Never true  ? Ran Out of Food in the Last  Year: Never true  ?Transportation Needs: No Transportation Needs  ? Lack of Transportation (Medical): No  ? Lack of Transportation (Non-Medical): No  ?Physical Activity: Inactive  ? Days of Exercise per Week: 0 days  ? Minutes of Exercise per Session: 0 min  ?Stress: No Stress Concern Present  ? Feeling of Stress : Only a  little  ?Social Connections: Socially Integrated  ? Frequency of Communication with Friends and Family: More than three times a week  ? Frequency of Social Gatherings with Friends and Family: Once a week  ? Attends Religious Services: More than 4 times per year  ? Active Member of Clubs or Organizations: Yes  ? Attends Archivist Meetings: More than 4 times per year  ? Marital Status: Married  ?Intimate Partner Violence: Not At Risk  ? Fear of Current or Ex-Partner: No  ? Emotionally Abused: No  ? Physically Abused: No  ? Sexually Abused: No  ? ? ? ?Current Outpatient Medications:  ?  hydrochlorothiazide (MICROZIDE) 12.5 MG capsule, TAKE 1 CAPSULE BY MOUTH EVERY DAY, Disp: 90 capsule, Rfl: 0 ?  metoprolol succinate (TOPROL-XL) 25 MG 24 hr tablet, Take 1 tablet (25 mg total) by mouth daily., Disp: 90 tablet, Rfl: 1 ?  Multiple Vitamin (MULTIVITAMIN) tablet, Take 1 tablet by mouth daily., Disp: , Rfl:  ?  pregabalin (LYRICA) 75 MG capsule, Take 1 capsule (75 mg total) by mouth 3 (three) times daily. (Patient not taking: Reported on 05/24/2021), Disp: 90 capsule, Rfl: 0 ? ?No Known Allergies ? ? ?ROS ? ?Constitutional: Negative for fever or weight change.  ?Respiratory: Negative for cough and shortness of breath.   ?Cardiovascular: Negative for chest pain or palpitations.  ?Gastrointestinal: Negative for abdominal pain, no bowel changes.  ?Musculoskeletal: Negative for gait problem or joint swelling.  ?Skin: Negative for rash.  ?Neurological: Negative for dizziness or headache.  ?No other specific complaints in a complete review of systems (except as listed in HPI above).   ? ?Objective ? ?Vitals:  ? 05/24/21 1312  ?BP: 112/70  ?Pulse: 91  ?Resp: 16  ?SpO2: 97%  ?Weight: 151 lb (68.5 kg)  ?Height: _0  (1.6 m)  ? ? ?Body mass index is 26.75 kg/m?. ? ?Physical Exam ? ?Constitutional: [place]

## 2021-05-24 NOTE — Patient Instructions (Signed)

## 2021-05-25 LAB — COMPLETE METABOLIC PANEL WITH GFR
AG Ratio: 1.7 (calc) (ref 1.0–2.5)
ALT: 12 U/L (ref 6–29)
AST: 16 U/L (ref 10–35)
Albumin: 4.3 g/dL (ref 3.6–5.1)
Alkaline phosphatase (APISO): 61 U/L (ref 37–153)
BUN: 13 mg/dL (ref 7–25)
CO2: 29 mmol/L (ref 20–32)
Calcium: 9.6 mg/dL (ref 8.6–10.4)
Chloride: 101 mmol/L (ref 98–110)
Creat: 0.86 mg/dL (ref 0.50–1.03)
Globulin: 2.5 g/dL (calc) (ref 1.9–3.7)
Glucose, Bld: 79 mg/dL (ref 65–99)
Potassium: 4.3 mmol/L (ref 3.5–5.3)
Sodium: 138 mmol/L (ref 135–146)
Total Bilirubin: 0.7 mg/dL (ref 0.2–1.2)
Total Protein: 6.8 g/dL (ref 6.1–8.1)
eGFR: 81 mL/min/{1.73_m2} (ref >=60)

## 2021-05-25 LAB — LIPID PANEL
Cholesterol: 175 mg/dL (ref <200)
HDL: 51 mg/dL (ref >=50)
LDL Cholesterol (Calc): 105 mg/dL (calc) — ABNORMAL HIGH
Non-HDL Cholesterol (Calc): 124 mg/dL (calc) (ref <130)
Total CHOL/HDL Ratio: 3.4 (calc) (ref <5.0)
Triglycerides: 101 mg/dL (ref <150)

## 2021-05-29 DIAGNOSIS — M1711 Unilateral primary osteoarthritis, right knee: Secondary | ICD-10-CM | POA: Diagnosis not present

## 2021-06-15 ENCOUNTER — Telehealth (INDEPENDENT_AMBULATORY_CARE_PROVIDER_SITE_OTHER): Payer: Self-pay | Admitting: Vascular Surgery

## 2021-06-15 NOTE — Telephone Encounter (Signed)
Spoke with pt regarding R/S appts due to GS being out of the office (leg). Pt was offered an appt on 5.4.23 but had a conference and requested that we look into the 3rd / 4th week of May. Pt is scheduled and nothing further is needed at this time.  ?

## 2021-06-19 ENCOUNTER — Ambulatory Visit (INDEPENDENT_AMBULATORY_CARE_PROVIDER_SITE_OTHER): Payer: BC Managed Care – PPO | Admitting: Vascular Surgery

## 2021-06-19 ENCOUNTER — Encounter (INDEPENDENT_AMBULATORY_CARE_PROVIDER_SITE_OTHER): Payer: BC Managed Care – PPO

## 2021-06-26 NOTE — Progress Notes (Signed)
Name: Erin [name]   MRN: [id number]    DOB: February 28, 1971   Date:06/27/2021 ? ?     Progress Note ? ?Subjective ? ?Chief Complaint ? ?Follow Up ? ?HPI ? ?HTN: She has been compliant with medications HCTZ and metoprolol , BP has been well controlled Denies chest pain or SOB, she has noticed some palpitation lately  ?  ?Dyslipidemia: last LDL improved, she has been eating healthier meals, trying to avoid junk food .  ? ?The 10-year ASCVD risk score (Arnett DK, et al., 2019) is: 1.8% ?  Values used to calculate the score: ?    Age: 51 years ?    Sex: Female ?    Is Non-Hispanic African American: No ?    Diabetic: No ?    Tobacco smoker: No ?    Systolic Blood Pressure: 956 mmHg ?    Is BP treated: Yes ?    HDL Cholesterol: 51 mg/dL ?    Total Cholesterol: 175 mg/dL  ? ?Post-herpetic neuralgia:  she states she has flares of pain on her left upper back, on the same spot, burning like, and no rash. She does not like taking medication daily, she has been taking ibuprofen prn, we will try lidoderm patch and see if she tolerates it better.  ? ?Overweight; she started intermittent fasting about one month ago and has lost 9 lbs , she is feeling good. Since she started intermittent fasting she has noticed bp dropping and heart rate going up , she wakes up a couple of times a week with palpitation that is not associated with sob or chest pain and improves when she drinks water. Discussed stopping hctz if bp remains low and increasing metoprolol to 50 mg to control palpitation. She will also try broth during the fasting hours.  ?  ?Patient Active Problem List  ? Diagnosis Date Noted  ? History of shingles 10/06/2020  ? Varicose veins with pain 07/05/2020  ? Lymphedema 07/04/2020  ? Chronic venous insufficiency 07/04/2020  ? Closed displaced fracture of phalanx of lesser toe of right foot 12/15/2018  ? Toe fracture, left 09/18/2018  ? Panic attack 10/10/2016  ? APC (atrial premature contractions) 05/27/2015  ? History of varicose  veins 05/27/2015  ? Vitamin D deficiency 05/27/2015  ? Chronic neck pain 05/27/2015  ? Fibromyalgia 05/27/2015  ? Tachycardia 02/05/2012  ? Essential (primary) hypertension 11/04/2008  ? ? ?Past Surgical History:  ?Procedure Laterality Date  ? [place]  ? ? ?Family History  ?Problem Relation Age of Onset  ? Heart attack Father 8  ? Hypertension Father   ? Heart attack Brother 34  ?     s/p stent   ? Hyperlipidemia Brother   ? Hypertension Brother   ? Melanoma Mother   ? Breast cancer Neg Hx   ? ? ?Social History  ? ?Tobacco Use  ? Smoking status: Never  ? Smokeless tobacco: Never  ?Substance Use Topics  ? Alcohol use: No  ?  Alcohol/week: 0.0 standard drinks  ? ? ? ?Current Outpatient Medications:  ?  hydrochlorothiazide (MICROZIDE) 12.5 MG capsule, TAKE 1 CAPSULE BY MOUTH EVERY DAY, Disp: 90 capsule, Rfl: 0 ?  metoprolol succinate (TOPROL-XL) 25 MG 24 hr tablet, Take 1 tablet (25 mg total) by mouth daily., Disp: 90 tablet, Rfl: 1 ?  Multiple Vitamin (MULTIVITAMIN) tablet, Take 1 tablet by mouth daily., Disp: , Rfl:  ? ?No Known Allergies ? ?I personally reviewed active problem list, medication  list, allergies, family history, social history, health maintenance with the patient/caregiver today. ? ? ?ROS ? ?Constitutional: Negative for fever, positive for  weight change.  ?Respiratory: Negative for cough and shortness of breath.   ?Cardiovascular: Negative for chest pain or palpitations.  ?Gastrointestinal: Negative for abdominal pain, no bowel changes.  ?Musculoskeletal: Negative for gait problem or joint swelling.  ?Skin: Negative for rash.  ?Neurological: Negative for dizziness or headache.  ?No other specific complaints in a complete review of systems (except as listed in HPI above).  ? ?Objective ? ?Vitals:  ? 06/27/21 1336  ?BP: 126/82  ?Pulse: 96  ?Resp: 16  ?SpO2: 100%  ?Weight: 143 lb (64.9 kg)  ?Height: _0  (1.6 m)  ? ? ?Body mass index is 25.33 kg/m?. ? ?Physical Exam ? ?Constitutional:  Patient appears well-developed and well-nourished.  No distress.  ?HEENT: head atraumatic, normocephalic, pupils equal and reactive to light, neck supple ?Cardiovascular: Normal rate, regular rhythm and normal heart sounds.  No murmur heard. No BLE edema. ?Pulmonary/Chest: Effort normal and breath sounds normal. No respiratory distress. ?Abdominal: Soft.  There is no tenderness. ?Psychiatric: Patient has a normal mood and affect. behavior is normal. Judgment and thought content normal.  ? ?Recent Results (from the past 2160 hour(s))  ?Lipid panel     Status: Abnormal  ? Collection Time: 05/24/21  1:50 PM  ?Result Value Ref Range  ? Cholesterol 175 <200 mg/dL  ? HDL 51 > OR = 50 mg/dL  ? Triglycerides 101 <150 mg/dL  ? LDL Cholesterol (Calc) 105 (H) mg/dL (calc)  ?  Comment: Reference range: <100 ?Marland Kitchen ?Desirable range <100 mg/dL for primary prevention;   ?<70 mg/dL for patients with CHD or diabetic patients  ?with > or = 2 CHD risk factors. ?. ?LDL-C is now calculated using the Martin-Hopkins  ?calculation, which is a validated novel method providing  ?better accuracy than the Friedewald equation in the  ?estimation of LDL-C.  ?Cresenciano Genre et al. Annamaria Helling. 1962;229(79): 2061-2068  ?(http://education.[facility].com/faq/FAQ164) ?  ? Total CHOL/HDL Ratio 3.4 <5.0 (calc)  ? Non-HDL Cholesterol (Calc) 124 <130 mg/dL (calc)  ?  Comment: For patients with diabetes plus 1 major ASCVD risk  ?factor, treating to a non-HDL-C goal of <100 mg/dL  ?(LDL-C of <70 mg/dL) is considered a therapeutic  ?option. ?  ?COMPLETE METABOLIC PANEL WITH GFR     Status: None  ? Collection Time: 05/24/21  1:50 PM  ?Result Value Ref Range  ? Glucose, Bld 79 65 - 99 mg/dL  ?  Comment: . ?           Fasting reference interval ?. ?  ? BUN 13 7 - 25 mg/dL  ? Creat 0.86 0.50 - 1.03 mg/dL  ? eGFR 81 > OR = 60 mL/min/1.16m  ?  Comment: The eGFR is based on the CKD-EPI 2021 equation. To calculate  ?the new eGFR from a previous Creatinine or Cystatin  C ?result, go to https://www.kidney.org/professionals/ ?kdoqi/gfr%5Fcalculator ?  ? BUN/Creatinine Ratio NOT APPLICABLE 6 - 22 (calc)  ? Sodium 138 135 - 146 mmol/L  ? Potassium 4.3 3.5 - 5.3 mmol/L  ? Chloride 101 98 - 110 mmol/L  ? CO2 29 20 - 32 mmol/L  ? Calcium 9.6 8.6 - 10.4 mg/dL  ? Total Protein 6.8 6.1 - 8.1 g/dL  ? Albumin 4.3 3.6 - 5.1 g/dL  ? Globulin 2.5 1.9 - 3.7 g/dL (calc)  ? AG Ratio 1.7 1.0 - 2.5 (calc)  ? Total Bilirubin 0.7  0.2 - 1.2 mg/dL  ? Alkaline phosphatase (APISO) 61 37 - 153 U/L  ? AST 16 10 - 35 U/L  ? ALT 12 6 - 29 U/L  ? ? ?PHQ2/9: ? ?  06/27/2021  ?  1:36 PM 05/24/2021  ?  1:12 PM 10/14/2020  ?  9:30 AM 02/19/2020  ? 10:32 AM 04/01/2019  ?  8:57 AM  ?Depression screen PHQ 2/9  ?Decreased Interest 0 0 0 0 0  ?Down, Depressed, Hopeless 0 0 0 0 0  ?PHQ - 2 Score 0 0 0 0 0  ?Altered sleeping 0 1  0 1  ?Tired, decreased energy 0 1  1 0  ?Change in appetite 0 0  0 0  ?Feeling bad or failure about yourself  0 0  0 0  ?Trouble concentrating 0 0  0 0  ?Moving slowly or fidgety/restless 0 0  0 0  ?Suicidal thoughts 0 0  0 0  ?PHQ-9 Score 0 _0 ?Difficult doing work/chores    Not difficult at all Not difficult at all  ?  ?phq 9 is negative ? ? ?Fall Risk: ? ?  06/27/2021  ?  1:35 PM 05/24/2021  ?  1:11 PM 10/14/2020  ?  9:30 AM 02/19/2020  ? 10:32 AM 04/01/2019  ?  8:57 AM  ?Fall Risk   ?Falls in the past year? 1 1 0 0 0  ?Number falls in past yr: 0 0 0 0 0  ?Injury with Fall? 1 1 0 0 0  ?Risk for fall due to : No Fall Risks No Fall Risks     ?Follow up Falls prevention discussed Falls prevention discussed     ? ? ? ? ?Functional Status Survey: ?Is the patient deaf or have difficulty hearing?: No ?Does the patient have difficulty seeing, even when wearing glasses/contacts?: No ?Does the patient have difficulty concentrating, remembering, or making decisions?: No ?Does the patient have difficulty walking or climbing stairs?: No ?Does the patient have difficulty dressing or bathing?: No ?Does the  patient have difficulty doing errands alone such as visiting a doctor's office or shopping?: No ? ? ? ?Assessment & Plan ? ?1. Dyslipidemia ? ? ?2. Essential (primary) hypertension ? ?- hydrochlorothiazide (MICROZ

## 2021-06-27 ENCOUNTER — Ambulatory Visit (INDEPENDENT_AMBULATORY_CARE_PROVIDER_SITE_OTHER): Payer: BC Managed Care – PPO | Admitting: Family Medicine

## 2021-06-27 ENCOUNTER — Encounter: Payer: Self-pay | Admitting: Family Medicine

## 2021-06-27 VITALS — BP 126/82 | HR 96 | Resp 16 | Ht 63.0 in | Wt 143.0 lb

## 2021-06-27 DIAGNOSIS — I10 Essential (primary) hypertension: Secondary | ICD-10-CM

## 2021-06-27 DIAGNOSIS — Z1211 Encounter for screening for malignant neoplasm of colon: Secondary | ICD-10-CM

## 2021-06-27 DIAGNOSIS — E785 Hyperlipidemia, unspecified: Secondary | ICD-10-CM | POA: Diagnosis not present

## 2021-06-27 DIAGNOSIS — B0229 Other postherpetic nervous system involvement: Secondary | ICD-10-CM | POA: Diagnosis not present

## 2021-06-27 DIAGNOSIS — Z23 Encounter for immunization: Secondary | ICD-10-CM | POA: Diagnosis not present

## 2021-06-27 DIAGNOSIS — R000 Tachycardia, unspecified: Secondary | ICD-10-CM

## 2021-06-27 MED ORDER — LIDOCAINE 5 % EX PTCH: 2.0000 | MEDICATED_PATCH | CUTANEOUS | 0 refills | Status: DC

## 2021-06-27 MED ORDER — METOPROLOL SUCCINATE ER 25 MG PO TB24: 25.0000 mg | ORAL_TABLET | Freq: Every day | ORAL | 1 refills | Status: DC

## 2021-06-27 MED ORDER — HYDROCHLOROTHIAZIDE 12.5 MG PO CAPS: ORAL_CAPSULE | ORAL | 1 refills | Status: DC

## 2021-07-24 ENCOUNTER — Encounter: Payer: Self-pay | Admitting: Family Medicine

## 2021-08-02 ENCOUNTER — Other Ambulatory Visit (INDEPENDENT_AMBULATORY_CARE_PROVIDER_SITE_OTHER): Payer: Self-pay | Admitting: Nurse Practitioner

## 2021-08-02 DIAGNOSIS — M79604 Pain in right leg: Secondary | ICD-10-CM

## 2021-08-02 DIAGNOSIS — I739 Peripheral vascular disease, unspecified: Secondary | ICD-10-CM | POA: Insufficient documentation

## 2021-08-02 NOTE — Progress Notes (Signed)
MRN : [id number]  Erin [name] is a 51 y.o. ([birth year]) female who presents with chief complaint of legs hurt and swell.  History of Present Illness:   The patient returns to the office for evaluation regarding right and left leg.  The right leg pain was abrupt in on set and very intense located posteriorly in the popliteal fossa.  It has gotten much better with time.  The patient also continues to have pain in the left lower extremity with dependency. The pain is lessened with elevation. Graduated compression stockings, Class I (20-30 mmHg), have been worn but the stockings do not eliminate the leg pain. Over-the-counter analgesics do not improve the symptoms. The degree of discomfort continues to interfere with daily activities. The patient notes the pain in the legs is causing problems with daily exercise, at the workplace and even with household activities and maintenance such as standing in the kitchen preparing meals and doing dishes.   Venous ultrasound shows normal deep venous system, no evidence of acute or chronic DVT.  Previous ablation is intact     No outpatient medications have been marked as taking for the 08/03/21 encounter (Appointment) with Delana Meyer, Dolores Lory, MD.    Past Medical History:  Diagnosis Date   Hypertension    Supraventricular premature beats    Vein, varicose     Past Surgical History:  Procedure Laterality Date   CESAREAN SECTION  1993    Social History Social History   Tobacco Use   Smoking status: Never   Smokeless tobacco: Never  Vaping Use   Vaping Use: Never used  Substance Use Topics   Alcohol use: No    Alcohol/week: 0.0 standard drinks   Drug use: No    Family History Family History  Problem Relation Age of Onset   Heart attack Father 58   Hypertension Father    Heart attack Brother 33       s/p stent    Hyperlipidemia Brother    Hypertension Brother    Melanoma Mother    Breast cancer Neg Hx     No Known  Allergies   REVIEW OF SYSTEMS (Negative unless checked)  Constitutional: '[]'$ Weight loss  '[]'$ Fever  '[]'$ Chills Cardiac: '[]'$ Chest pain   '[]'$ Chest pressure   '[]'$ Palpitations   '[]'$ Shortness of breath when laying flat   '[]'$ Shortness of breath with exertion. Vascular:  '[]'$ Pain in legs with walking   '[x]'$ Pain in legs at rest  '[]'$ History of DVT   '[]'$ Phlebitis   '[x]'$ Swelling in legs   '[]'$ Varicose veins   '[]'$ Non-healing ulcers Pulmonary:   '[]'$ Uses home oxygen   '[]'$ Productive cough   '[]'$ Hemoptysis   '[]'$ Wheeze  '[]'$ COPD   '[]'$ Asthma Neurologic:  '[]'$ Dizziness   '[]'$ Seizures   '[]'$ History of stroke   '[]'$ History of TIA  '[]'$ Aphasia   '[]'$ Vissual changes   '[]'$ Weakness or numbness in arm   '[]'$ Weakness or numbness in leg Musculoskeletal:   '[]'$ Joint swelling   '[]'$ Joint pain   '[]'$ Low back pain Hematologic:  '[]'$ Easy bruising  '[]'$ Easy bleeding   '[]'$ Hypercoagulable state   '[]'$ Anemic Gastrointestinal:  '[]'$ Diarrhea   '[]'$ Vomiting  '[]'$ Gastroesophageal reflux/heartburn   '[]'$ Difficulty swallowing. Genitourinary:  '[]'$ Chronic kidney disease   '[]'$ Difficult urination  '[]'$ Frequent urination   '[]'$ Blood in urine Skin:  '[]'$ Rashes   '[]'$ Ulcers  Psychological:  '[]'$ History of anxiety   '[]'$  History of major depression.  Physical Examination  There were no vitals filed for this visit. There is no height or weight on file to calculate BMI.  Gen: WD/WN, NAD Head: [place]/AT, No temporalis wasting.  Ear/Nose/Throat: Hearing grossly intact, nares w/o erythema or drainage, pinna without lesions Eyes: PER, EOMI, sclera nonicteric.  Neck: Supple, no gross masses.  No JVD.  Pulmonary:  Good air movement, no audible wheezing, no use of accessory muscles.  Cardiac: RRR, precordium not hyperdynamic. Vascular:  scattered varicosities present bilaterally with left leg having >10 mm.  Moderate venous stasis changes to the legs bilaterally.  2+ soft pitting edema  Vessel Right Left  Radial Palpable Palpable  Gastrointestinal: soft, non-distended. No guarding/no peritoneal signs.  Musculoskeletal: M/S  5/5 throughout.  No deformity.  Neurologic: CN 2-12 intact. Pain and light touch intact in extremities.  Symmetrical.  Speech is fluent. Motor exam as listed above. Psychiatric: Judgment intact, Mood & affect appropriate for pt's clinical situation. Dermatologic: Venous rashes no ulcers noted.  No changes consistent with cellulitis. Lymph : No lichenification or skin changes of chronic lymphedema.  CBC Lab Results  Component Value Date   WBC 4.9 02/19/2020   HGB 13.6 02/19/2020   HCT 39.5 02/19/2020   MCV 99.0 02/19/2020   PLT 258 02/19/2020    BMET    Component Value Date/Time   NA 138 05/24/2021 1350   NA 138 02/02/2012 1024   K 4.3 05/24/2021 1350   K 3.4 (L) 02/02/2012 1024   CL 101 05/24/2021 1350   CL 104 02/02/2012 1024   CO2 29 05/24/2021 1350   CO2 28 02/02/2012 1024   GLUCOSE 79 05/24/2021 1350   GLUCOSE 90 02/02/2012 1024   BUN 13 05/24/2021 1350   BUN 9 02/02/2012 1024   CREATININE 0.86 05/24/2021 1350   CALCIUM 9.6 05/24/2021 1350   CALCIUM 8.9 02/02/2012 1024   GFRNONAA 76 02/19/2020 1125   GFRAA 88 02/19/2020 1125   CrCl cannot be calculated (Patient's most recent lab result is older than the maximum 21 days allowed.).  COAG No results found for: INR, PROTIME  Radiology No results found.   Assessment/Plan 1. Leg pain, posterior, right I believe this was a ruptured baker's cyst or a mild strain.  It appears to be self limited   2. Varicose veins with pain Recommend:  The patient has had successful ablation of the previously incompetent saphenous venous system but still has persistent symptoms of pain and swelling that are having a negative impact on daily life and daily activities.  Patient should undergo injection sclerotherapy to treat the residual varicosities.  The risks, benefits and alternative therapies were reviewed in detail with the patient.  All questions were answered.  The patient agrees to proceed with sclerotherapy at their  convenience.  The patient will continue wearing the graduated compression stockings and using the over-the-counter pain medications to treat her symptoms.     3. Chronic venous insufficiency No surgery or intervention at this point in time.    I have discussed with the patient venous insufficiency and why it  causes symptoms. I have discussed with the patient the chronic skin changes that accompany venous insufficiency and the long term sequela such as infection and ulceration.  Patient will begin wearing graduated compression stockings or compression wraps on a daily basis.  The patient will put the compression on first thing in the morning and removing them in the evening. The patient is instructed specifically not to sleep in the compression.    In addition, behavioral modification including several periods of elevation of the lower extremities during the day will be continued. I have demonstrated  that proper elevation is a position with the ankles at heart level.  The patient is instructed to begin routine exercise, especially walking on a daily basis  The patient will be assessed for a Lymph Pump depending on the effectiveness of conservative therapy and the control of the associated lymphedema.   4. PAD (peripheral artery disease) ([place]) Recommend:  I do not find evidence of life style limiting vascular disease. The patient specifically denies life style limitation.  Previous noninvasive studies including ABI's of the legs do not identify critical vascular problems.  The patient should continue walking and begin a more formal exercise program. The patient should continue his antiplatelet therapy and aggressive treatment of the lipid abnormalities.  The patient is instructed to call the office if there is a significant change in the lower extremity symptoms, particularly if a wound develops or there is an abrupt increase in leg pain.  5. Essential (primary) hypertension Continue  antihypertensive medications as already ordered, these medications have been reviewed and there are no changes at this time.     Hortencia Pilar, MD  08/02/2021 9:07 PM

## 2021-08-03 ENCOUNTER — Ambulatory Visit (INDEPENDENT_AMBULATORY_CARE_PROVIDER_SITE_OTHER): Payer: BC Managed Care – PPO

## 2021-08-03 ENCOUNTER — Ambulatory Visit (INDEPENDENT_AMBULATORY_CARE_PROVIDER_SITE_OTHER): Payer: BC Managed Care – PPO | Admitting: Vascular Surgery

## 2021-08-03 ENCOUNTER — Encounter (INDEPENDENT_AMBULATORY_CARE_PROVIDER_SITE_OTHER): Payer: Self-pay | Admitting: Vascular Surgery

## 2021-08-03 VITALS — BP 113/69 | HR 67 | Resp 16 | Wt 140.6 lb

## 2021-08-03 DIAGNOSIS — M79604 Pain in right leg: Secondary | ICD-10-CM | POA: Diagnosis not present

## 2021-08-03 DIAGNOSIS — I83819 Varicose veins of unspecified lower extremities with pain: Secondary | ICD-10-CM | POA: Diagnosis not present

## 2021-08-03 DIAGNOSIS — I739 Peripheral vascular disease, unspecified: Secondary | ICD-10-CM

## 2021-08-03 DIAGNOSIS — I872 Venous insufficiency (chronic) (peripheral): Secondary | ICD-10-CM | POA: Diagnosis not present

## 2021-08-03 DIAGNOSIS — I10 Essential (primary) hypertension: Secondary | ICD-10-CM

## 2021-08-05 ENCOUNTER — Encounter (INDEPENDENT_AMBULATORY_CARE_PROVIDER_SITE_OTHER): Payer: Self-pay | Admitting: Vascular Surgery

## 2021-08-05 DIAGNOSIS — M79604 Pain in right leg: Secondary | ICD-10-CM | POA: Insufficient documentation

## 2021-08-07 DIAGNOSIS — Z1211 Encounter for screening for malignant neoplasm of colon: Secondary | ICD-10-CM | POA: Diagnosis not present

## 2021-08-14 LAB — COLOGUARD: COLOGUARD: NEGATIVE

## 2021-09-01 ENCOUNTER — Encounter (INDEPENDENT_AMBULATORY_CARE_PROVIDER_SITE_OTHER): Payer: Self-pay | Admitting: Nurse Practitioner

## 2021-09-01 ENCOUNTER — Ambulatory Visit (INDEPENDENT_AMBULATORY_CARE_PROVIDER_SITE_OTHER): Payer: Self-pay | Admitting: Nurse Practitioner

## 2021-09-01 VITALS — BP 164/88 | HR 67 | Resp 16 | Ht 63.0 in | Wt 139.0 lb

## 2021-09-01 DIAGNOSIS — I83819 Varicose veins of unspecified lower extremities with pain: Secondary | ICD-10-CM | POA: Diagnosis not present

## 2021-09-01 DIAGNOSIS — I83812 Varicose veins of left lower extremities with pain: Secondary | ICD-10-CM

## 2021-09-03 ENCOUNTER — Encounter (INDEPENDENT_AMBULATORY_CARE_PROVIDER_SITE_OTHER): Payer: Self-pay | Admitting: Nurse Practitioner

## 2021-09-07 ENCOUNTER — Ambulatory Visit
Admission: RE | Admit: 2021-09-07 | Discharge: 2021-09-07 | Disposition: A | Discharge status: Home / Self Care | Payer: BC Managed Care – PPO | Source: Ambulatory Visit | Attending: Family Medicine | Admitting: Family Medicine

## 2021-09-07 DIAGNOSIS — Z1231 Encounter for screening mammogram for malignant neoplasm of breast: Secondary | ICD-10-CM | POA: Diagnosis not present

## 2021-09-21 ENCOUNTER — Telehealth: Payer: BC Managed Care – PPO

## 2021-09-21 ENCOUNTER — Telehealth: Payer: BC Managed Care – PPO | Admitting: Physician Assistant

## 2021-09-21 DIAGNOSIS — J019 Acute sinusitis, unspecified: Secondary | ICD-10-CM | POA: Diagnosis not present

## 2021-09-21 DIAGNOSIS — B9689 Other specified bacterial agents as the cause of diseases classified elsewhere: Secondary | ICD-10-CM | POA: Diagnosis not present

## 2021-09-21 MED ORDER — AMOXICILLIN-POT CLAVULANATE 875-125 MG PO TABS: 1.0000 | ORAL_TABLET | Freq: Two times a day (BID) | ORAL | 0 refills | Status: DC

## 2021-09-21 NOTE — Patient Instructions (Signed)
Erin [name], thank you for joining Mar Daring, PA-C for today's virtual visit.  While this provider is not your primary care provider (PCP), if your PCP is located in our provider database this encounter information will be shared with them immediately following your visit.  Consent: (Patient) Erin [name] provided verbal consent for this virtual visit at the beginning of the encounter.  Current Medications:  Current Outpatient Medications:    amoxicillin-clavulanate (AUGMENTIN) 875-125 MG tablet, Take 1 tablet by mouth 2 (two) times daily., Disp: 14 tablet, Rfl: 0   hydrochlorothiazide (MICROZIDE) 12.5 MG capsule, TAKE 1 CAPSULE BY MOUTH EVERY DAY (Patient not taking: Reported on 08/03/2021), Disp: 90 capsule, Rfl: 1   lidocaine (LIDODERM) 5 %, Place 2 patches onto the skin daily. Remove & Discard patch within 12 hours or as directed by MD (Patient not taking: Reported on 08/03/2021), Disp: 60 patch, Rfl: 0   metoprolol succinate (TOPROL-XL) 25 MG 24 hr tablet, Take 1 tablet (25 mg total) by mouth daily., Disp: 90 tablet, Rfl: 1   Multiple Vitamin (MULTIVITAMIN) tablet, Take 1 tablet by mouth daily., Disp: , Rfl:    Medications ordered in this encounter:  Meds ordered this encounter  Medications   amoxicillin-clavulanate (AUGMENTIN) 875-125 MG tablet    Sig: Take 1 tablet by mouth 2 (two) times daily.    Dispense:  14 tablet    Refill:  0    Order Specific Question:   Supervising Provider    Answer:   Sabra Heck, BRIAN [[id number]]     *If you need refills on other medications prior to your next appointment, please contact your pharmacy*  Follow-Up: Call back or seek an in-person evaluation if the symptoms worsen or if the condition fails to improve as anticipated.  Other Instructions Sinus Infection, Adult A sinus infection, also called sinusitis, is inflammation of your sinuses. Sinuses are hollow spaces in the bones around your face. Your sinuses are located: Around  your eyes. In the middle of your forehead. Behind your nose. In your cheekbones. Mucus normally drains out of your sinuses. When your nasal tissues become inflamed or swollen, mucus can become trapped or blocked. This allows bacteria, viruses, and fungi to grow, which leads to infection. Most infections of the sinuses are caused by a virus. A sinus infection can develop quickly. It can last for up to 4 weeks (acute) or for more than 12 weeks (chronic). A sinus infection often develops after a cold. What are the causes? This condition is caused by anything that creates swelling in the sinuses or stops mucus from draining. This includes: Allergies. Asthma. Infection from bacteria or viruses. Deformities or blockages in your nose or sinuses. Abnormal growths in the nose (nasal polyps). Pollutants, such as chemicals or irritants in the air. Infection from fungi. This is rare. What increases the risk? You are more likely to develop this condition if you: Have a weak body defense system (immune system). Do a lot of swimming or diving. Overuse nasal sprays. Smoke. What are the signs or symptoms? The main symptoms of this condition are pain and a feeling of pressure around the affected sinuses. Other symptoms include: Stuffy nose or congestion that makes it difficult to breathe through your nose. Thick yellow or greenish drainage from your nose. Tenderness, swelling, and warmth over the affected sinuses. A cough that may get worse at night. Decreased sense of smell and taste. Extra mucus that collects in the throat or the back of the nose (postnasal  drip) causing a sore throat or bad breath. Tiredness (fatigue). Fever. How is this diagnosed? This condition is diagnosed based on: Your symptoms. Your medical history. A physical exam. Tests to find out if your condition is acute or chronic. This may include: Checking your nose for nasal polyps. Viewing your sinuses using a device that has  a light (endoscope). Testing for allergies or bacteria. Imaging tests, such as an MRI or CT scan. In rare cases, a bone biopsy may be done to rule out more serious types of fungal sinus disease. How is this treated? Treatment for a sinus infection depends on the cause and whether your condition is chronic or acute. If caused by a virus, your symptoms should go away on their own within 10 days. You may be given medicines to relieve symptoms. They include: Medicines that shrink swollen nasal passages (decongestants). A spray that eases inflammation of the nostrils (topical intranasal corticosteroids). Rinses that help get rid of thick mucus in your nose (nasal saline washes). Medicines that treat allergies (antihistamines). Over-the-counter pain relievers. If caused by bacteria, your health care provider may recommend waiting to see if your symptoms improve. Most bacterial infections will get better without antibiotic medicine. You may be given antibiotics if you have: A severe infection. A weak immune system. If caused by narrow nasal passages or nasal polyps, surgery may be needed. Follow these instructions at home: Medicines Take, use, or apply over-the-counter and prescription medicines only as told by your health care provider. These may include nasal sprays. If you were prescribed an antibiotic medicine, take it as told by your health care provider. Do not stop taking the antibiotic even if you start to feel better. Hydrate and humidify  Drink enough fluid to keep your urine pale yellow. Staying hydrated will help to thin your mucus. Use a cool mist humidifier to keep the humidity level in your home above 50%. Inhale steam for 10-15 minutes, 3-4 times a day, or as told by your health care provider. You can do this in the bathroom while a hot shower is running. Limit your exposure to cool or dry air. Rest Rest as much as possible. Sleep with your head raised (elevated). Make sure you  get enough sleep each night. General instructions  Apply a warm, moist washcloth to your face 3-4 times a day or as told by your health care provider. This will help with discomfort. Use nasal saline washes as often as told by your health care provider. Wash your hands often with soap and water to reduce your exposure to germs. If soap and water are not available, use hand sanitizer. Do not smoke. Avoid being around people who are smoking (secondhand smoke). Keep all follow-up visits. This is important. Contact a health care provider if: You have a fever. Your symptoms get worse. Your symptoms do not improve within 10 days. Get help right away if: You have a severe headache. You have persistent vomiting. You have severe pain or swelling around your face or eyes. You have vision problems. You develop confusion. Your neck is stiff. You have trouble breathing. These symptoms may be an emergency. Get help right away. Call 911. Do not wait to see if the symptoms will go away. Do not drive yourself to the hospital. Summary A sinus infection is soreness and inflammation of your sinuses. Sinuses are hollow spaces in the bones around your face. This condition is caused by nasal tissues that become inflamed or swollen. The swelling traps or blocks  the flow of mucus. This allows bacteria, viruses, and fungi to grow, which leads to infection. If you were prescribed an antibiotic medicine, take it as told by your health care provider. Do not stop taking the antibiotic even if you start to feel better. Keep all follow-up visits. This is important. This information is not intended to replace advice given to you by your health care provider. Make sure you discuss any questions you have with your health care provider. Document Revised: 01/31/2021 Document Reviewed: 01/31/2021 Elsevier Patient Education  [place].    If you have been instructed to have an in-person evaluation today at a  local Urgent Care facility, please use the link below. It will take you to a list of all of our available [facility], including address, phone number and hours of operation. Please do not delay care.  [facility]  If you or a family member do not have a primary care provider, use the link below to schedule a visit and establish care. When you choose a [facility] primary care physician or advanced practice provider, you gain a long-term partner in health. Find a Primary Care Provider  Learn more about 's in-office and virtual care options: [facility]

## 2021-09-21 NOTE — Progress Notes (Signed)
Virtual Visit Consent   Erin [name], you are scheduled for a virtual visit with a [facility] provider today. Just as with appointments in the office, your consent must be obtained to participate. Your consent will be active for this visit and any virtual visit you may have with one of our providers in the next 365 days. If you have a MyChart account, a copy of this consent can be sent to you electronically.  As this is a virtual visit, video technology does not allow for your provider to perform a traditional examination. This may limit your provider's ability to fully assess your condition. If your provider identifies any concerns that need to be evaluated in person or the need to arrange testing (such as labs, EKG, etc.), we will make arrangements to do so. Although advances in technology are sophisticated, we cannot ensure that it will always work on either your end or our end. If the connection with a video visit is poor, the visit may have to be switched to a telephone visit. With either a video or telephone visit, we are not always able to ensure that we have a secure connection.  By engaging in this virtual visit, you consent to the provision of healthcare and authorize for your insurance to be billed (if applicable) for the services provided during this visit. Depending on your insurance coverage, you may receive a charge related to this service.  I need to obtain your verbal consent now. Are you willing to proceed with your visit today? Erin [name] has provided verbal consent on 09/21/2021 for a virtual visit (video or telephone). Mar Daring, PA-C  Date: 09/21/2021 7:51 AM  Virtual Visit via Video Note   I, Mar Daring, connected with  Erin [name]  ([id number], 1970-04-08) on 09/21/21 at  7:45 AM EDT by a video-enabled telemedicine application and verified that I am speaking with the correct person using two identifiers.  Location: Patient: Virtual  Visit Location Patient: Home Provider: Virtual Visit Location Provider: Home Office   I discussed the limitations of evaluation and management by telemedicine and the availability of in person appointments. The patient expressed understanding and agreed to proceed.    History of Present Illness: Erin [name] is a 51 y.o. who identifies as a female who was assigned female at birth, and is being seen today for URI symptoms.  HPI: URI  This is a new problem. The current episode started in the past 7 days. The problem has been gradually worsening. There has been no fever. Associated symptoms include congestion, coughing (mild), ear pain (bilateral), headaches, a plugged ear sensation, rhinorrhea, sinus pain and a sore throat (on one side where she feels drainage). She has tried increased fluids and sleep (theraflu) for the symptoms. The treatment provided no relief.      Problems:  Patient Active Problem List   Diagnosis Date Noted   Leg pain, posterior, right 08/05/2021   PAD (peripheral artery disease) ([place]) 08/02/2021   History of shingles 10/06/2020   Varicose veins with pain 07/05/2020   Lymphedema 07/04/2020   Chronic venous insufficiency 07/04/2020   Closed displaced fracture of phalanx of lesser toe of right foot 12/15/2018   Toe fracture, left 09/18/2018   Panic attack 10/10/2016   APC (atrial premature contractions) 05/27/2015   History of varicose veins 05/27/2015   Vitamin D deficiency 05/27/2015   Chronic neck pain 05/27/2015   Fibromyalgia 05/27/2015   Tachycardia 02/05/2012   Essential (primary)  hypertension 11/04/2008    Allergies: No Known Allergies Medications:  Current Outpatient Medications:    amoxicillin-clavulanate (AUGMENTIN) 875-125 MG tablet, Take 1 tablet by mouth 2 (two) times daily., Disp: 14 tablet, Rfl: 0   hydrochlorothiazide (MICROZIDE) 12.5 MG capsule, TAKE 1 CAPSULE BY MOUTH EVERY DAY (Patient not taking: Reported on 08/03/2021), Disp: 90  capsule, Rfl: 1   lidocaine (LIDODERM) 5 %, Place 2 patches onto the skin daily. Remove & Discard patch within 12 hours or as directed by MD (Patient not taking: Reported on 08/03/2021), Disp: 60 patch, Rfl: 0   metoprolol succinate (TOPROL-XL) 25 MG 24 hr tablet, Take 1 tablet (25 mg total) by mouth daily., Disp: 90 tablet, Rfl: 1   Multiple Vitamin (MULTIVITAMIN) tablet, Take 1 tablet by mouth daily., Disp: , Rfl:   Observations/Objective: Patient is [name]-developed, [name]-nourished in no acute distress.  Resting comfortably at home.  Head is normocephalic, atraumatic.  No labored breathing.  Speech is clear and coherent with logical content.  Patient is alert and oriented at baseline.  Congested sounding  Assessment and Plan: 1. Acute bacterial sinusitis - amoxicillin-clavulanate (AUGMENTIN) 875-125 MG tablet; Take 1 tablet by mouth 2 (two) times daily.  Dispense: 14 tablet; Refill: 0  - Worsening symptoms that have not responded to OTC medications.  - Will give Augmentin - Continue allergy medications.  - Steam and humidifier can help - Stay [name] hydrated and get plenty of rest.  - Seek in person evaluation if no symptom improvement or if symptoms worsen   Follow Up Instructions: I discussed the assessment and treatment plan with the patient. The patient was provided an opportunity to ask questions and all were answered. The patient agreed with the plan and demonstrated an understanding of the instructions.  A copy of instructions were sent to the patient via MyChart unless otherwise noted below.    The patient was advised to call back or seek an in-person evaluation if the symptoms worsen or if the condition fails to improve as anticipated.  Time:  I spent 8 minutes with the patient via telehealth technology discussing the above problems/concerns.    Mar Daring, PA-C

## 2021-09-25 ENCOUNTER — Ambulatory Visit (INDEPENDENT_AMBULATORY_CARE_PROVIDER_SITE_OTHER): Payer: BC Managed Care – PPO | Admitting: Nurse Practitioner

## 2021-09-25 ENCOUNTER — Encounter (INDEPENDENT_AMBULATORY_CARE_PROVIDER_SITE_OTHER): Payer: Self-pay | Admitting: Nurse Practitioner

## 2021-09-25 VITALS — BP 147/88 | HR 60 | Resp 16 | Wt 138.0 lb

## 2021-09-25 DIAGNOSIS — I8312 Varicose veins of left lower extremity with inflammation: Secondary | ICD-10-CM | POA: Diagnosis not present

## 2021-09-25 DIAGNOSIS — I83819 Varicose veins of unspecified lower extremities with pain: Secondary | ICD-10-CM

## 2021-09-25 NOTE — Progress Notes (Signed)
Varicose veins of left lower extremity with inflammation (454.1  I83.10) Current Plans   Indication: Patient presents with symptomatic varicose veins of the left lower extremity.   Procedure: Sclerotherapy using hypertonic saline mixed with 1% Lidocaine was performed on the left lower extremity. Compression wraps were placed. The patient tolerated the procedure well. 

## 2021-10-16 DIAGNOSIS — H524 Presbyopia: Secondary | ICD-10-CM | POA: Diagnosis not present

## 2021-10-16 DIAGNOSIS — H5213 Myopia, bilateral: Secondary | ICD-10-CM | POA: Diagnosis not present

## 2021-10-23 ENCOUNTER — Ambulatory Visit (INDEPENDENT_AMBULATORY_CARE_PROVIDER_SITE_OTHER): Payer: BC Managed Care – PPO | Admitting: Nurse Practitioner

## 2021-11-20 ENCOUNTER — Ambulatory Visit (INDEPENDENT_AMBULATORY_CARE_PROVIDER_SITE_OTHER): Payer: BC Managed Care – PPO | Admitting: Vascular Surgery

## 2021-11-22 ENCOUNTER — Ambulatory Visit: Payer: BC Managed Care – PPO | Admitting: Dermatology

## 2021-12-13 ENCOUNTER — Telehealth (INDEPENDENT_AMBULATORY_CARE_PROVIDER_SITE_OTHER): Payer: Self-pay | Admitting: Nurse Practitioner

## 2022-01-08 ENCOUNTER — Encounter (INDEPENDENT_AMBULATORY_CARE_PROVIDER_SITE_OTHER): Payer: Self-pay

## 2022-05-25 NOTE — Patient Instructions (Signed)
Preventive Care 54-54 Years Old, Female Preventive care refers to lifestyle choices and visits with your health care provider that can promote health and wellness. Preventive care visits are also called wellness exams. What can I expect for my preventive care visit? Counseling Your health care provider may ask you questions about your: Medical history, including: Past medical problems. Family medical history. Pregnancy history. Current health, including: Menstrual cycle. Method of birth control. Emotional well-being. Home life and relationship well-being. Sexual activity and sexual health. Lifestyle, including: Alcohol, nicotine or tobacco, and drug use. Access to firearms. Diet, exercise, and sleep habits. Work and work environment. Sunscreen use. Safety issues such as seatbelt and bike helmet use. Physical exam Your health care provider will check your: Height and weight. These may be used to calculate your BMI (body mass index). BMI is a measurement that tells if you are at a healthy weight. Waist circumference. This measures the distance around your waistline. This measurement also tells if you are at a healthy weight and may help predict your risk of certain diseases, such as type 2 diabetes and high blood pressure. Heart rate and blood pressure. Body temperature. Skin for abnormal spots. What immunizations do I need?  Vaccines are usually given at various ages, according to a schedule. Your health care provider will recommend vaccines for you based on your age, medical history, and lifestyle or other factors, such as travel or where you work. What tests do I need? Screening Your health care provider may recommend screening tests for certain conditions. This may include: Lipid and cholesterol levels. Diabetes screening. This is done by checking your blood sugar (glucose) after you have not eaten for a while (fasting). Pelvic exam and Pap test. Hepatitis B test. Hepatitis C  test. HIV (human immunodeficiency virus) test. STI (sexually transmitted infection) testing, if you are at risk. Lung cancer screening. Colorectal cancer screening. Mammogram. Talk with your health care provider about when you should start having regular mammograms. This may depend on whether you have a family history of breast cancer. BRCA-related cancer screening. This may be done if you have a family history of breast, ovarian, tubal, or peritoneal cancers. Bone density scan. This is done to screen for osteoporosis. Talk with your health care provider about your test results, treatment options, and if necessary, the need for more tests. Follow these instructions at home: Eating and drinking  Eat a diet that includes fresh fruits and vegetables, whole grains, lean protein, and low-fat dairy products. Take vitamin and mineral supplements as recommended by your health care provider. Do not drink alcohol if: Your health care provider tells you not to drink. You are pregnant, may be pregnant, or are planning to become pregnant. If you drink alcohol: Limit how much you have to 0-1 drink a day. Know how much alcohol is in your drink. In the [place] one drink equals one 12 oz bottle of beer (355 mL), one 5 oz glass of wine (148 mL), or one 1 oz glass of hard liquor (44 mL). Lifestyle Brush your teeth every morning and night with fluoride toothpaste. Floss one time each day. Exercise for at least 30 minutes 5 or more days each week. Do not use any products that contain nicotine or tobacco. These products include cigarettes, chewing tobacco, and vaping devices, such as e-cigarettes. If you need help quitting, ask your health care provider. Do not use drugs. If you are sexually active, practice safe sex. Use a condom or other form of protection to   prevent STIs. If you do not wish to become pregnant, use a form of birth control. If you plan to become pregnant, see your health care provider for a  prepregnancy visit. Take aspirin only as told by your health care provider. Make sure that you understand how much to take and what form to take. Work with your health care provider to find out whether it is safe and beneficial for you to take aspirin daily. Find healthy ways to manage stress, such as: Meditation, yoga, or listening to music. Journaling. Talking to a trusted person. Spending time with friends and family. Minimize exposure to UV radiation to reduce your risk of skin cancer. Safety Always wear your seat belt while driving or riding in a vehicle. Do not drive: If you have been drinking alcohol. Do not ride with someone who has been drinking. When you are tired or distracted. While texting. If you have been using any mind-altering substances or drugs. Wear a helmet and other protective equipment during sports activities. If you have firearms in your house, make sure you follow all gun safety procedures. Seek help if you have been physically or sexually abused. What's next? Visit your health care provider once a year for an annual wellness visit. Ask your health care provider how often you should have your eyes and teeth checked. Stay up to date on all vaccines. This information is not intended to replace advice given to you by your health care provider. Make sure you discuss any questions you have with your health care provider. Document Revised: 08/24/2020 Document Reviewed: 08/24/2020 Elsevier Patient Education  2023 [employer]  

## 2022-05-25 NOTE — Progress Notes (Unsigned)
Name: DEVENA ARCENEAUX   MRN: [id number]    DOB: [birth year]   Date:05/28/2022       Progress Note  Subjective  Chief Complaint  Annual Exam  HPI  Patient presents for annual CPE.  Diet: intermittent fasting, balanced food  Exercise:  discussed 150 minutes per week  Last Eye Exam: up to date  Last Dental Exam: up to date   [employer] Visit from 05/28/2022 in [facility]  AUDIT-C Score 0      Depression: Phq 9 is  negative    05/28/2022    7:41 AM 06/27/2021    1:36 PM 05/24/2021    1:12 PM 10/14/2020    9:30 AM 02/19/2020   10:32 AM  Depression screen PHQ 2/9  Decreased Interest 0 0 0 0 0  Down, Depressed, Hopeless 0 0 0 0 0  PHQ - 2 Score 0 0 0 0 0  Altered sleeping 0 0 1  0  Tired, decreased energy 0 0 1  1  Change in appetite 0 0 0  0  Feeling bad or failure about yourself  0 0 0  0  Trouble concentrating 0 0 0  0  Moving slowly or fidgety/restless 0 0 0  0  Suicidal thoughts 0 0 0  0  PHQ-9 Score 0 0 2  1  Difficult doing work/chores     Not difficult at all   Hypertension: BP Readings from Last 3 Encounters:  05/28/22 118/70  09/25/21 (!) 147/88  09/01/21 (!) 164/88   Obesity: Wt Readings from Last 3 Encounters:  05/28/22 139 lb (63 kg)  09/25/21 138 lb (62.6 kg)  09/01/21 139 lb (63 kg)   BMI Readings from Last 3 Encounters:  05/28/22 24.62 kg/m  09/25/21 24.45 kg/m  09/01/21 24.62 kg/m     Vaccines:   Tdap: up to date Shingrix: refused  Pneumonia: N/A Flu: 2021, she will return in the Fall  COVID-19: N/A   Hep C Screening: 01/06/19 STD testing and prevention (HIV/chl/gon/syphilis): 01/06/19 Intimate partner violence: negative screen  Sexual History : one partner, husband  Menstrual History/LMP/Abnormal Bleeding: she thinks last cycle was end of March 2023 or April 2023  Discussed importance of follow up if any post-menopausal bleeding: yes  Incontinence Symptoms: negative for symptoms   Breast  cancer:  - Last Mammogram: 09/07/21 - BRCA gene screening: N/A  Osteoporosis Prevention : Discussed high calcium and vitamin D supplementation, weight bearing exercises Bone density: N/A  Cervical cancer screening: 02/19/20, repeat in 5 years   Skin cancer: Discussed monitoring for atypical lesions  Colorectal cancer: 08/07/21  - cologuard, repeat in 2026  Lung cancer:  Low Dose CT Chest recommended if Age 85-80 years, 20 pack-year currently smoking OR have quit w/in 15years. Patient does not qualify for screen   ECG: 10/07/16  Advanced Care Planning: A voluntary discussion about advance care planning including the explanation and discussion of advance directives.  Discussed health care proxy and Living will, and the patient was able to identify a health care proxy as husband.  Patient does not have a living will and power of attorney of health care   Lipids: Lab Results  Component Value Date   CHOL 175 05/24/2021   CHOL 192 02/19/2020   CHOL 189 01/06/2019   Lab Results  Component Value Date   HDL 51 05/24/2021   HDL 58 02/19/2020   HDL 50 01/06/2019   Lab Results  Component Value Date  LDLCALC 105 (H) 05/24/2021   LDLCALC 119 (H) 02/19/2020   LDLCALC 119 (H) 01/06/2019   Lab Results  Component Value Date   TRIG 101 05/24/2021   TRIG 61 02/19/2020   TRIG 95 01/06/2019   Lab Results  Component Value Date   CHOLHDL 3.4 05/24/2021   CHOLHDL 3.3 02/19/2020   CHOLHDL 3.8 01/06/2019   No results found for: "LDLDIRECT"  Glucose: Glucose  Date Value Ref Range Status  02/02/2012 90 65 - 99 mg/dL Final   Glucose, Bld  Date Value Ref Range Status  05/24/2021 79 65 - 99 mg/dL Final    Comment:    .            Fasting reference interval .   02/19/2020 86 65 - 99 mg/dL Final    Comment:    .            Fasting reference interval .   01/06/2019 84 65 - 99 mg/dL Final    Comment:    .            Fasting reference interval .     Patient Active Problem List    Diagnosis Date Noted   Leg pain, posterior, right 08/05/2021   PAD (peripheral artery disease) ([place]) 08/02/2021   History of shingles 10/06/2020   Varicose veins with pain 07/05/2020   Lymphedema 07/04/2020   Chronic venous insufficiency 07/04/2020   Closed displaced fracture of phalanx of lesser toe of right foot 12/15/2018   Toe fracture, left 09/18/2018   Panic attack 10/10/2016   APC (atrial premature contractions) 05/27/2015   History of varicose veins 05/27/2015   Vitamin D deficiency 05/27/2015   Chronic neck pain 05/27/2015   Fibromyalgia 05/27/2015   Tachycardia 02/05/2012   Essential (primary) hypertension 11/04/2008    Past Surgical History:  Procedure Laterality Date   CESAREAN SECTION  1993    Family History  Problem Relation Age of Onset   Melanoma Mother    Cancer Father 18       lung cancer   Heart attack Father 57   Hypertension Father    Heart attack Brother 58       s/p stent    Hyperlipidemia Brother    Hypertension Brother    Breast cancer Neg Hx     Social History   Socioeconomic History   Marital status: Married    Spouse name: Not on file   Number of children: 3   Years of education: Not on file   Highest education level: Associate degree: occupational, Hotel manager, or vocational program  Occupational History   Occupation: Education officer, museum  Tobacco Use   Smoking status: Never   Smokeless tobacco: Never  Vaping Use   Vaping Use: Never used  Substance and Sexual Activity   Alcohol use: No    Alcohol/week: 0.0 standard drinks of alcohol   Drug use: No   Sexual activity: Yes    Partners: Male    Birth control/protection: Surgical  Other Topics Concern   Not on file  Social History Narrative   Working from home   One grown child moved back with them in the Summer of 2023 with his wife and 4 children   Social Determinants of Health   Financial Resource Strain: Low Risk  (05/28/2022)   Overall Financial Resource Strain (CARDIA)     Difficulty of Paying Living Expenses: Not hard at all  Food Insecurity: No Food Insecurity (05/28/2022)   Hunger Vital Sign  Worried About Charity fundraiser in the Last Year: Never true    [place] in the Last Year: Never true  Transportation Needs: No Transportation Needs (05/28/2022)   PRAPARE - Hydrologist (Medical): No    Lack of Transportation (Non-Medical): No  Physical Activity: Insufficiently Active (05/28/2022)   Exercise Vital Sign    Days of Exercise per Week: 2 days    Minutes of Exercise per Session: 30 min  Stress: No Stress Concern Present (05/28/2022)   [place]    Feeling of Stress : Not at all  Social Connections: [place] (05/28/2022)   Social Connection and Isolation Panel [NHANES]    Frequency of Communication with Friends and Family: More than three times a week    Frequency of Social Gatherings with Friends and Family: More than three times a week    Attends Religious Services: More than 4 times per year    Active Member of [employer] or Organizations: Yes    Attends Music therapist: More than 4 times per year    Marital Status: Married  Human resources officer Violence: Not At Risk (05/28/2022)   Humiliation, Afraid, Rape, and Kick questionnaire    Fear of Current or Ex-Partner: No    Emotionally Abused: No    Physically Abused: No    Sexually Abused: No     Current Outpatient Medications:    metoprolol succinate (TOPROL-XL) 25 MG 24 hr tablet, Take 1 tablet (25 mg total) by mouth daily., Disp: 90 tablet, Rfl: 1   Multiple Vitamin (MULTIVITAMIN) tablet, Take 1 tablet by mouth daily., Disp: , Rfl:   No Known Allergies   ROS  Constitutional: Negative for fever or weight change.  Respiratory: Negative for cough and shortness of breath.   Cardiovascular: Negative for chest pain or palpitations.  Gastrointestinal: Negative for abdominal  pain, no bowel changes.  Musculoskeletal: Negative for gait problem or joint swelling.  Skin: Negative for rash.  Neurological: Negative for dizziness or headache.  No other specific complaints in a complete review of systems (except as listed in HPI above).   Objective  Vitals:   05/28/22 0741  BP: 118/70  Pulse: 88  Resp: 16  SpO2: 98%  Weight: 139 lb (63 kg)  Height: 5\' 3"  (1.6 m)    Body mass index is 24.62 kg/m.  Physical Exam  Constitutional: Patient appears well-developed and well-nourished. No distress.  HENT: Head: Normocephalic and atraumatic. Ears: B TMs ok, no erythema or effusion; Nose: Nose normal. Mouth/Throat: Oropharynx is clear and moist. No oropharyngeal exudate.  Eyes: Conjunctivae and EOM are normal. Pupils are equal, round, and reactive to light. No scleral icterus.  Neck: Normal range of motion. Neck supple. No JVD present. No thyromegaly present.  Cardiovascular: Normal rate, regular rhythm and normal heart sounds.  No murmur heard. No BLE edema. Pulmonary/Chest: Effort normal and breath sounds normal. No respiratory distress. Abdominal: Soft. Bowel sounds are normal, no distension. There is no tenderness. no masses Breast: no lumps or masses, no nipple discharge or rashes FEMALE GENITALIA:  External genitalia normal External urethra normal Vaginal vault normal without discharge or lesions Cervix normal without discharge or lesions Bimanual exam normal without masses RECTAL: not done  Musculoskeletal: Normal range of motion, no joint effusions. No gross deformities Neurological: he is alert and oriented to person, place, and time. No cranial nerve deficit. Coordination, balance, strength, speech and gait are  normal.  Skin: Skin is warm and dry. No rash noted. No erythema.  Psychiatric: Patient has a normal mood and affect. behavior is normal. Judgment and thought content normal.   Fall Risk:    05/28/2022    7:41 AM 06/27/2021    1:35 PM 05/24/2021     1:11 PM 10/14/2020    9:30 AM 02/19/2020   10:32 AM  Fall Risk   Falls in the past year? 0 1 1 0 0  Number falls in past yr: 0 0 0 0 0  Injury with Fall? 0 1 1 0 0  Risk for fall due to : No Fall Risks No Fall Risks No Fall Risks    Follow up Falls prevention discussed Falls prevention discussed Falls prevention discussed       Functional Status Survey: Is the patient deaf or have difficulty hearing?: No Does the patient have difficulty seeing, even when wearing glasses/contacts?: No Does the patient have difficulty concentrating, remembering, or making decisions?: No Does the patient have difficulty walking or climbing stairs?: No Does the patient have difficulty dressing or bathing?: No Does the patient have difficulty doing errands alone such as visiting a doctor's office or shopping?: No   Assessment & Plan  1. Well adult exam  - Lipid panel - CBC with Differential/Platelet - COMPLETE METABOLIC PANEL WITH GFR - Hemoglobin A1c - VITAMIN D 25 Hydroxy (Vit-D Deficiency, Fractures) - MM 3D SCREENING MAMMOGRAM BILATERAL BREAST; Future  She stopped HCTZ due to low bp, she lost a lot of weight with intermittent fasting. She still takes metoprolol prn for palpitation   2. Dyslipidemia  - Lipid panel  3. Long-term use of high-risk medication  - CBC with Differential/Platelet - COMPLETE METABOLIC PANEL WITH GFR  4. Breast cancer screening by mammogram  - MM 3D SCREENING MAMMOGRAM BILATERAL BREAST; Future  5. Diabetes mellitus screening  - Hemoglobin A1c  6. Vitamin D deficiency  - VITAMIN D 25 Hydroxy (Vit-D Deficiency, Fractures)   7. Family history of melanoma  - Ambulatory referral to Dermatology   -USPSTF grade A and B recommendations reviewed with patient; age-appropriate recommendations, preventive care, screening tests, etc discussed and encouraged; healthy living encouraged; see AVS for patient education given to patient -Discussed importance of 150  minutes of physical activity weekly, eat two servings of fish weekly, eat one serving of tree nuts ( cashews, pistachios, pecans, almonds.Marland Kitchen) every other day, eat 6 servings of fruit/vegetables daily and drink plenty of water and avoid sweet beverages.   -Reviewed Health Maintenance: Yes.

## 2022-05-28 ENCOUNTER — Ambulatory Visit (INDEPENDENT_AMBULATORY_CARE_PROVIDER_SITE_OTHER): Payer: BC Managed Care – PPO | Admitting: Family Medicine

## 2022-05-28 ENCOUNTER — Encounter: Payer: Self-pay | Admitting: Family Medicine

## 2022-05-28 VITALS — BP 118/70 | HR 88 | Resp 16 | Ht 63.0 in | Wt 139.0 lb

## 2022-05-28 DIAGNOSIS — Z79899 Other long term (current) drug therapy: Secondary | ICD-10-CM

## 2022-05-28 DIAGNOSIS — Z131 Encounter for screening for diabetes mellitus: Secondary | ICD-10-CM

## 2022-05-28 DIAGNOSIS — Z1231 Encounter for screening mammogram for malignant neoplasm of breast: Secondary | ICD-10-CM

## 2022-05-28 DIAGNOSIS — E559 Vitamin D deficiency, unspecified: Secondary | ICD-10-CM | POA: Diagnosis not present

## 2022-05-28 DIAGNOSIS — Z0000 Encounter for general adult medical examination without abnormal findings: Secondary | ICD-10-CM

## 2022-05-28 DIAGNOSIS — E785 Hyperlipidemia, unspecified: Secondary | ICD-10-CM | POA: Diagnosis not present

## 2022-05-28 DIAGNOSIS — Z808 Family history of malignant neoplasm of other organs or systems: Secondary | ICD-10-CM

## 2022-05-29 LAB — CBC WITH DIFFERENTIAL/PLATELET
Absolute Monocytes: 543 cells/uL (ref 200–950)
Basophils Absolute: 18 cells/uL (ref 0–200)
Basophils Relative: 0.4 %
Eosinophils Absolute: 101 cells/uL (ref 15–500)
Eosinophils Relative: 2.2 %
HCT: 39.5 % (ref 35.0–45.0)
Hemoglobin: 13.5 g/dL (ref 11.7–15.5)
Lymphs Abs: 1624 cells/uL (ref 850–3900)
MCH: 32.6 pg (ref 27.0–33.0)
MCHC: 34.2 g/dL (ref 32.0–36.0)
MCV: 95.4 fL (ref 80.0–100.0)
MPV: 11.1 fL (ref 7.5–12.5)
Monocytes Relative: 11.8 %
Neutro Abs: 2314 cells/uL (ref 1500–7800)
Neutrophils Relative %: 50.3 %
Platelets: 235 10*3/uL (ref 140–400)
RBC: 4.14 10*6/uL (ref 3.80–5.10)
RDW: 12.1 % (ref 11.0–15.0)
Total Lymphocyte: 35.3 %
WBC: 4.6 10*3/uL (ref 3.8–10.8)

## 2022-05-29 LAB — COMPLETE METABOLIC PANEL WITH GFR
AG Ratio: 1.7 (calc) (ref 1.0–2.5)
ALT: 11 U/L (ref 6–29)
AST: 15 U/L (ref 10–35)
Albumin: 4.7 g/dL (ref 3.6–5.1)
Alkaline phosphatase (APISO): 62 U/L (ref 37–153)
BUN: 12 mg/dL (ref 7–25)
CO2: 32 mmol/L (ref 20–32)
Calcium: 9.8 mg/dL (ref 8.6–10.4)
Chloride: 105 mmol/L (ref 98–110)
Creat: 0.83 mg/dL (ref 0.50–1.03)
Globulin: 2.7 g/dL (calc) (ref 1.9–3.7)
Glucose, Bld: 94 mg/dL (ref 65–99)
Potassium: 4.9 mmol/L (ref 3.5–5.3)
Sodium: 142 mmol/L (ref 135–146)
Total Bilirubin: 0.6 mg/dL (ref 0.2–1.2)
Total Protein: 7.4 g/dL (ref 6.1–8.1)
eGFR: 84 mL/min/{1.73_m2} (ref >=60)

## 2022-05-29 LAB — LIPID PANEL
Cholesterol: 205 mg/dL — ABNORMAL HIGH (ref <200)
HDL: 62 mg/dL (ref >=50)
LDL Cholesterol (Calc): 128 mg/dL (calc) — ABNORMAL HIGH
Non-HDL Cholesterol (Calc): 143 mg/dL (calc) — ABNORMAL HIGH (ref <130)
Total CHOL/HDL Ratio: 3.3 (calc) (ref <5.0)
Triglycerides: 48 mg/dL (ref <150)

## 2022-05-29 LAB — HEMOGLOBIN A1C
Hgb A1c MFr Bld: 5.5 % of total Hgb (ref <5.7)
Mean Plasma Glucose: 111 mg/dL
eAG (mmol/L): 6.2 mmol/L

## 2022-05-29 LAB — VITAMIN D 25 HYDROXY (VIT D DEFICIENCY, FRACTURES): Vit D, 25-Hydroxy: 41 ng/mL (ref 30–100)

## 2022-07-11 NOTE — Telephone Encounter (Signed)
Error

## 2022-11-12 ENCOUNTER — Other Ambulatory Visit: Payer: Self-pay | Admitting: Family Medicine

## 2022-11-12 DIAGNOSIS — R000 Tachycardia, unspecified: Secondary | ICD-10-CM

## 2022-11-12 DIAGNOSIS — I10 Essential (primary) hypertension: Secondary | ICD-10-CM

## 2022-11-13 ENCOUNTER — Other Ambulatory Visit: Payer: Self-pay | Admitting: Family Medicine

## 2022-11-13 DIAGNOSIS — R000 Tachycardia, unspecified: Secondary | ICD-10-CM

## 2022-11-13 DIAGNOSIS — I10 Essential (primary) hypertension: Secondary | ICD-10-CM

## 2022-11-13 MED ORDER — METOPROLOL SUCCINATE ER 25 MG PO TB24: 25.0000 mg | ORAL_TABLET | Freq: Every day | ORAL | 0 refills | Status: DC

## 2022-11-15 ENCOUNTER — Other Ambulatory Visit: Payer: Self-pay | Admitting: Family Medicine

## 2022-11-15 ENCOUNTER — Other Ambulatory Visit: Payer: Self-pay

## 2022-11-15 DIAGNOSIS — I10 Essential (primary) hypertension: Secondary | ICD-10-CM

## 2022-11-15 MED ORDER — HYDROCHLOROTHIAZIDE 12.5 MG PO CAPS: 12.5000 mg | ORAL_CAPSULE | Freq: Every day | ORAL | 0 refills | Status: DC

## 2022-12-21 ENCOUNTER — Ambulatory Visit: Payer: Self-pay | Admitting: Family Medicine

## 2023-03-26 ENCOUNTER — Ambulatory Visit
Admission: RE | Admit: 2023-03-26 | Discharge: 2023-03-26 | Disposition: A | Discharge status: Home / Self Care | Payer: BC Managed Care – PPO | Source: Ambulatory Visit | Attending: Family Medicine | Admitting: Family Medicine

## 2023-03-26 DIAGNOSIS — Z0000 Encounter for general adult medical examination without abnormal findings: Secondary | ICD-10-CM

## 2023-03-26 DIAGNOSIS — Z1231 Encounter for screening mammogram for malignant neoplasm of breast: Secondary | ICD-10-CM | POA: Diagnosis not present

## 2023-03-27 ENCOUNTER — Other Ambulatory Visit: Payer: Self-pay | Admitting: Family Medicine

## 2023-03-27 DIAGNOSIS — R928 Other abnormal and inconclusive findings on diagnostic imaging of breast: Secondary | ICD-10-CM

## 2023-04-03 ENCOUNTER — Ambulatory Visit
Admission: RE | Admit: 2023-04-03 | Discharge: 2023-04-03 | Disposition: A | Discharge status: Home / Self Care | Payer: BC Managed Care – PPO | Source: Ambulatory Visit | Attending: Family Medicine | Admitting: Family Medicine

## 2023-04-03 DIAGNOSIS — R928 Other abnormal and inconclusive findings on diagnostic imaging of breast: Secondary | ICD-10-CM | POA: Insufficient documentation

## 2023-04-03 DIAGNOSIS — R92333 Mammographic heterogeneous density, bilateral breasts: Secondary | ICD-10-CM | POA: Diagnosis not present

## 2023-04-03 DIAGNOSIS — N6321 Unspecified lump in the left breast, upper outer quadrant: Secondary | ICD-10-CM | POA: Diagnosis not present

## 2023-05-21 ENCOUNTER — Ambulatory Visit (INDEPENDENT_AMBULATORY_CARE_PROVIDER_SITE_OTHER): Payer: Self-pay | Admitting: Dermatology

## 2023-05-21 DIAGNOSIS — L719 Rosacea, unspecified: Secondary | ICD-10-CM

## 2023-05-21 DIAGNOSIS — W908XXA Exposure to other nonionizing radiation, initial encounter: Secondary | ICD-10-CM

## 2023-05-21 DIAGNOSIS — L814 Other melanin hyperpigmentation: Secondary | ICD-10-CM

## 2023-05-21 DIAGNOSIS — L821 Other seborrheic keratosis: Secondary | ICD-10-CM

## 2023-05-21 DIAGNOSIS — L858 Other specified epidermal thickening: Secondary | ICD-10-CM

## 2023-05-21 DIAGNOSIS — D229 Melanocytic nevi, unspecified: Secondary | ICD-10-CM

## 2023-05-21 DIAGNOSIS — D224 Melanocytic nevi of scalp and neck: Secondary | ICD-10-CM

## 2023-05-21 DIAGNOSIS — L578 Other skin changes due to chronic exposure to nonionizing radiation: Secondary | ICD-10-CM

## 2023-05-21 DIAGNOSIS — D1801 Hemangioma of skin and subcutaneous tissue: Secondary | ICD-10-CM

## 2023-05-21 DIAGNOSIS — Z1283 Encounter for screening for malignant neoplasm of skin: Secondary | ICD-10-CM | POA: Diagnosis not present

## 2023-05-21 DIAGNOSIS — Z808 Family history of malignant neoplasm of other organs or systems: Secondary | ICD-10-CM

## 2023-05-21 MED ORDER — METRONIDAZOLE 0.75 % EX CREA: TOPICAL_CREAM | CUTANEOUS | 11 refills | Status: DC

## 2023-05-21 NOTE — Patient Instructions (Addendum)
 Melanoma ABCDEs  Melanoma is the most dangerous type of skin cancer, and is the leading cause of death from skin disease.  You are more likely to develop melanoma if you: Have light-colored skin, light-colored eyes, or red or blond hair Spend a lot of time in the sun Tan regularly, either outdoors or in a tanning bed Have had blistering sunburns, especially during childhood Have a close family member who has had a melanoma Have atypical moles or large birthmarks  Early detection of melanoma is key since treatment is typically straightforward and cure rates are extremely high if we catch it early.   The first sign of melanoma is often a change in a mole or a new dark spot.  The ABCDE system is a way of remembering the signs of melanoma.  A for asymmetry:  The two halves do not match. B for border:  The edges of the growth are irregular. C for color:  A mixture of colors are present instead of an even brown color. D for diameter:  Melanomas are usually (but not always) greater than 6mm - the size of a pencil eraser. E for evolution:  The spot keeps changing in size, shape, and color.  Please check your skin once per month between visits. You can use a small mirror in front and a large mirror behind you to keep an eye on the back side or your body.   If you see any new or changing lesions before your next follow-up, please call to schedule a visit.  Please continue daily skin protection including broad spectrum sunscreen SPF 30+ to sun-exposed areas, reapplying every 2 hours as needed when you're outdoors.   Staying in the shade or wearing long sleeves, sun glasses (UVA+UVB protection) and wide brim hats (4-inch brim around the entire circumference of the hat) are also recommended for sun protection.      Rosacea  What is rosacea? Rosacea (say: ro-zay-sha) is a common skin disease that usually begins as a trend of flushing or blushing easily.  As rosacea progresses, a persistent redness  in the center of the face will develop and may gradually spread beyond the nose and cheeks to the forehead and chin.  In some cases, the ears, chest, and back could be affected.  Rosacea may appear as tiny blood vessels or small red bumps that occur in crops.  Frequently they can contain pus, and are called "pustules".  If the bumps do not contain pus, they are referred to as "papules".  Rarely, in prolonged, untreated cases of rosacea, the oil glands of the nose and cheeks may become permanently enlarged.  This is called rhinophyma, and is seen more frequently in men.  Signs and Risks In its beginning stages, rosacea tends to come and go, which makes it difficult to recognize.  It can start as intermittent flushing of the face.  Eventually, blood vessels may become permanently visible.  Pustules and papules can appear, but can be mistaken for adult acne.  People of all races, ages, genders and ethnic groups are at risk of developing rosacea.  However, it is more common in women (especially around menopause) and adults with fair skin between the ages of 79 and 24.  Treatment Dermatologists typically recommend a combination of treatments to effectively manage rosacea.  Treatment can improve symptoms and may stop the progression of the rosacea.  Treatment may involve both topical and oral medications.  The tetracycline antibiotics are often used for their anti-inflammatory effect; however,  because of the possibility of developing antibiotic resistance, they should not be used long term at full dose.  For dilated blood vessels the options include electrodessication (uses electric current through a small needle), laser treatment, and cosmetics to hide the redness.   With all forms of treatment, improvement is a slow process, and patients may not see any results for the first 3-4 weeks.  It is very important to avoid the sun and other triggers.  Patients must wear sunscreen daily.  Skin Care  Instructions: Cleanse the skin with a mild soap such as CeraVe cleanser, Cetaphil cleanser, or Dove soap once or twice daily as needed. Moisturize with Eucerin Redness Relief Daily Perfecting Lotion (has a subtle green tint), CeraVe Moisturizing Cream, or Oil of Olay Daily Moisturizer with sunscreen every morning and/or night as recommended. Makeup should be "non-comedogenic" (won't clog pores) and be labeled "for sensitive skin". Good choices for cosmetics are: Neutrogena, Almay, and Physician's Formula.  Any product with a green tint tends to offset a red complexion. If your eyes are dry and irritated, use artificial tears 2-3 times per day and cleanse the eyelids daily with baby shampoo.  Have your eyes examined at least every 2 years.  Be sure to tell your eye doctor that you have rosacea. Alcoholic beverages tend to cause flushing of the skin, and may make rosacea worse. Always wear sunscreen, protect your skin from extreme hot and cold temperatures, and avoid spicy foods, hot drinks, and mechanical irritation such as rubbing, scrubbing, or massaging the face.  Avoid harsh skin cleansers, cleansing masks, astringents, and exfoliation. If a particular product burns or makes your face feel tight, then it is likely to flare your rosacea. If you are having difficulty finding a sunscreen that you can tolerate, you may try switching to a chemical-free sunscreen.  These are ones whose active ingredient is zinc oxide or titanium dioxide only.  They should also be fragrance free, non-comedogenic, and labeled for sensitive skin. Rosacea triggers may vary from person to person.  There are a variety of foods that have been reported to trigger rosacea.  Some patients find that keeping a diary of what they were doing when they flared helps them avoid triggers.     Due to recent changes in healthcare laws, you may see results of your pathology and/or laboratory studies on MyChart before the doctors have had a  chance to review them. We understand that in some cases there may be results that are confusing or concerning to you. Please understand that not all results are received at the same time and often the doctors may need to interpret multiple results in order to provide you with the best plan of care or course of treatment. Therefore, we ask that you please give [place] 2 business days to thoroughly review all your results before contacting the office for clarification. Should we see a critical lab result, you will be contacted sooner.   If You Need Anything After Your Visit  If you have any questions or concerns for your doctor, please call our main line at [phone] and press option 4 to reach your doctor's medical assistant. If no one answers, please leave a voicemail as directed and we will return your call as soon as possible. Messages left after 4 pm will be answered the following business day.   You may also send [place] a message via MyChart. We typically respond to MyChart messages within 1-2 business days.  For prescription refills, please  ask your pharmacy to contact our office. Our fax number is [phone].  If you have an urgent issue when the clinic is closed that cannot wait until the next business day, you can page your doctor at the number below.    Please note that while we do our best to be available for urgent issues outside of office hours, we are not available 24/7.   If you have an urgent issue and are unable to reach [place], you may choose to seek medical care at your doctor's office, retail clinic, urgent care center, or emergency room.  If you have a medical emergency, please immediately call 911 or go to the emergency department.  Pager Numbers  - Dr. Gwen Pounds: [phone]  - Dr. Roseanne Reno: [phone]  - Dr. Katrinka Blazing: [phone]   In the event of inclement weather, please call our main line at [phone] for an update on the status of any delays or closures.  Dermatology  Medication Tips: Please keep the boxes that topical medications come in in order to help keep track of the instructions about where and how to use these. Pharmacies typically print the medication instructions only on the boxes and not directly on the medication tubes.   If your medication is too expensive, please contact our office at [phone] option 4 or send [place] a message through MyChart.   We are unable to tell what your co-pay for medications will be in advance as this is different depending on your insurance coverage. However, we may be able to find a substitute medication at lower cost or fill out paperwork to get insurance to cover a needed medication.   If a prior authorization is required to get your medication covered by your insurance company, please allow [place] 1-2 business days to complete this process.  Drug prices often vary depending on where the prescription is filled and some pharmacies may offer cheaper prices.  The website www.goodrx.com contains coupons for medications through different pharmacies. The prices here do not account for what the cost may be with help from insurance (it may be cheaper with your insurance), but the website can give you the price if you did not use any insurance.  - You can print the associated coupon and take it with your prescription to the pharmacy.  - You may also stop by our office during regular business hours and pick up a GoodRx coupon card.  - If you need your prescription sent electronically to a different pharmacy, notify our office through [facility] or by phone at [phone] option 4.     Si Usted Necesita Algo Despus de Su Visita  Tambin puede enviarnos un mensaje a travs de Clinical cytogeneticist. Por lo general respondemos a los mensajes de MyChart en el transcurso de 1 a 2 das hbiles.  Para renovar recetas, por favor pida a su farmacia que se ponga en contacto con nuestra oficina. Annie Sable de fax es [place] [phone].  Si  tiene un asunto urgente cuando la clnica est cerrada y que no puede esperar hasta el siguiente da hbil, puede llamar/localizar a su doctor(a) al nmero que aparece a continuacin.   Por favor, tenga en cuenta que aunque hacemos todo lo posible para estar disponibles para asuntos urgentes fuera del horario de [place], no estamos disponibles las 24 horas del da, los 7 [address]  [address] de la [place].   Si tiene un problema urgente y no puede comunicarse con nosotros, puede optar por buscar atencin Sports administrator de  su doctor(a), en una clnica privada, en un centro de atencin urgente o en una sala de emergencias.  Si tiene Engineer, drilling, por favor llame inmediatamente al 911 o vaya a la sala de emergencias.  Nmeros de bper  - Dr. Gwen Pounds: [phone]  - Dra. Roseanne Reno: [phone]  - Dr. Katrinka Blazing: [phone]   En caso de inclemencias del tiempo, por favor llame a Lacy Duverney principal al [phone] para una actualizacin sobre el [place] de cualquier retraso o cierre.  Consejos para la medicacin en dermatologa: Por favor, guarde las cajas en las que vienen los medicamentos de uso tpico para ayudarle a seguir las instrucciones sobre dnde y cmo usarlos. Las farmacias generalmente imprimen las instrucciones del medicamento slo en las cajas y no directamente en los tubos del [place].   Si su medicamento es muy caro, por favor, pngase en contacto con Rolm [place] llamando al [phone] y presione la opcin 4 o envenos un mensaje a travs de Clinical cytogeneticist.   No podemos decirle cul ser su copago por los medicamentos por adelantado ya que esto es diferente dependiendo de la cobertura de su seguro. Sin embargo, es posible que podamos encontrar un medicamento sustituto a Audiological scientist un formulario para que el seguro cubra el medicamento que se considera necesario.   Si se requiere una autorizacin previa para que su compaa de seguros [place] su medicamento, por  favor permtanos de 1 a 2 das hbiles para completar [address]  Los precios de los medicamentos varan con frecuencia dependiendo del Environmental consultant de dnde se surte la receta y alguna farmacias pueden ofrecer precios ms baratos.  El sitio web www.goodrx.com tiene cupones para medicamentos de Health and safety inspector. Los precios aqu no tienen en cuenta lo que podra costar con la ayuda del seguro (puede ser ms barato con su seguro), pero el sitio web puede darle el precio si no utiliz Tourist information centre manager.  - Puede imprimir el cupn correspondiente y llevarlo con su receta a la farmacia.  - Tambin puede pasar por nuestra oficina durante el horario de atencin regular y Education officer, museum una tarjeta de cupones de GoodRx.  - Si necesita que su receta se enve electrnicamente a una farmacia diferente, informe a nuestra oficina a travs de MyChart de [place] o por telfono llamando al [phone] y presione la opcin 4.

## 2023-05-21 NOTE — Progress Notes (Deleted)
   Follow-Up Visit   Subjective  Erin [name] is a 53 y.o. female who presents for the following: Skin Cancer Screening and Full Body Skin Exam  The patient presents for Total-Body Skin Exam (TBSE) for skin cancer screening and mole check. The patient has spots, moles and lesions to be evaluated, some may be new or changing and the patient may have concern these could be cancer. She has a family history of melanoma in     The following portions of the chart were reviewed this encounter and updated as appropriate: medications, allergies, medical history  Review of Systems:  No other skin or systemic complaints except as noted in HPI or Assessment and Plan.  Objective  Well appearing patient in no apparent distress; mood and affect are within normal limits.  A full examination was performed including scalp, head, eyes, ears, nose, lips, neck, chest, axillae, abdomen, back, buttocks, bilateral upper extremities, bilateral lower extremities, hands, feet, fingers, toes, fingernails, and toenails. All findings within normal limits unless otherwise noted below.   Relevant physical exam findings are noted in the Assessment and Plan.    Assessment & Plan   SKIN CANCER SCREENING PERFORMED TODAY.  ACTINIC DAMAGE - Chronic condition, secondary to cumulative UV/sun exposure - diffuse scaly erythematous macules with underlying dyspigmentation - Recommend daily broad spectrum sunscreen SPF 30+ to sun-exposed areas, reapply every 2 hours as needed.  - Staying in the shade or wearing long sleeves, sun glasses (UVA+UVB protection) and wide brim hats (4-inch brim around the entire circumference of the hat) are also recommended for sun protection.  - Call for new or changing lesions.  LENTIGINES, SEBORRHEIC KERATOSES, HEMANGIOMAS - Benign normal skin lesions - Benign-appearing - Call for any changes  MELANOCYTIC NEVI - Tan-brown and/or pink-flesh-colored symmetric macules and papules -  Benign appearing on exam today - Observation - Call clinic for new or changing moles - Recommend daily use of broad spectrum spf 30+ sunscreen to sun-exposed areas.   FAMILY HISTORY OF SKIN CANCER What type(s):melanoma Who affected:mother   No follow-ups on file.  ICherlyn Labella, CMA, am acting as scribe for Willeen Niece, MD .   Documentation: I have reviewed the above documentation for accuracy and completeness, and I agree with the above.  Willeen Niece, MD

## 2023-05-21 NOTE — Progress Notes (Unsigned)
 New [name] Visit   Subjective  Erin [name] is a 53 y.o. female who presents for Erin following: Skin Cancer Screening and Full Body Skin Exam  Erin [name] presents for Total-Body Skin Exam (TBSE) for skin cancer screening and mole check. Erin [name] has spots, moles and lesions to be evaluated, some may be new or changing. No history of skin cancer. Family history of melanoma in mother at age 29.   Erin following portions of Erin chart were reviewed this encounter and updated as appropriate: medications, allergies, medical history  Review of Systems:  No other skin or systemic complaints except as noted in HPI or Assessment and Plan.  Objective  Well appearing [name] in no apparent distress; mood and affect are within normal limits.  A full examination was performed including scalp, head, eyes, ears, nose, lips, neck, chest, axillae, abdomen, back, buttocks, bilateral upper extremities, bilateral lower extremities, hands, feet, fingers, toes, fingernails, and toenails. All findings within normal limits unless otherwise noted below.   Relevant physical exam findings are noted in Erin Assessment and Plan.    Assessment & Plan   SKIN CANCER SCREENING PERFORMED TODAY.  ACTINIC DAMAGE - Chronic condition, secondary to cumulative UV/sun exposure - diffuse scaly erythematous macules with underlying dyspigmentation - Recommend daily broad spectrum sunscreen SPF 30+ to sun-exposed areas, reapply every 2 hours as needed.  - Staying in Erin shade or wearing long sleeves, sun glasses (UVA+UVB protection) and wide brim hats (4-inch brim around Erin entire circumference of Erin hat) are also recommended for sun protection.  - Call for new or changing lesions.  LENTIGINES, SEBORRHEIC KERATOSES, HEMANGIOMAS - Benign normal skin lesions - Benign-appearing - Call for any changes  MELANOCYTIC NEVI - Tan-brown and/or pink-flesh-colored symmetric macules and papules, including flesh brown  papule at left posterior lower neck - Benign appearing on exam today - Observation - Call clinic for new or changing moles - Recommend daily use of broad spectrum spf 30+ sunscreen to sun-exposed areas.   FAMILY HISTORY OF SKIN CANCER What type(s):melanoma Who affected:mother  KERATOSIS PILARIS - Tiny follicular keratotic papules - Benign. Genetic in nature. No cure. - Observe. - If desired, [name] can use an emollient (moisturizer) containing ammonium lactate (AmLactin), urea or salicylic acid once a day to smooth Erin area  Recommend starting moisturizer with exfoliant (Urea, Salicylic acid, or Lactic acid) one to two times daily to help smooth rough and bumpy skin.  OTC options include Cetaphil Rough and Bumpy lotion (Urea), Eucerin Roughness Relief lotion or spot treatment cream (Urea), CeraVe SA lotion/cream for Rough and Bumpy skin (Sal Acid), [place] Rough and Bumpy cream (Sal Acid), and AmLactin 12% lotion/cream (Lactic Acid).  If applying in morning, also apply sunscreen to sun-exposed areas, since these exfoliating moisturizers can increase sensitivity to sun.   ROSACEA Exam Nose and malar cheeks with erythema and telangiectasias, pt gets bumps off and on   Chronic and persistent condition with duration or expected duration over one year. Condition is bothersome/symptomatic for [name]. Currently flared.    Rosacea is a chronic progressive skin condition usually affecting Erin face of adults, causing redness and/or acne bumps. It is treatable but not curable. It sometimes affects Erin eyes (ocular rosacea) as well. It may respond to topical and/or systemic medication and can flare with stress, sun exposure, alcohol, exercise, topical steroids (including hydrocortisone/cortisone 10) and some foods.  Daily application of broad spectrum spf 30+ sunscreen to face is recommended to reduce flares.  Treatment  Plan Start metronidazole 0.75% cream Apply to face every night for rosacea,  twice daily with flares dsp 45g 1 yr Rf.    Return in about 1 year (around 05/20/2024) for TBSE.  ICherlyn Labella, CMA, am acting as scribe for Willeen Niece, MD .   Documentation: I have reviewed Erin above documentation for accuracy and completeness, and I agree with Erin above.  Willeen Niece, MD

## 2023-05-30 ENCOUNTER — Ambulatory Visit (INDEPENDENT_AMBULATORY_CARE_PROVIDER_SITE_OTHER): Payer: Self-pay | Admitting: Family Medicine

## 2023-05-30 ENCOUNTER — Encounter: Payer: Self-pay | Admitting: Family Medicine

## 2023-05-30 VITALS — BP 130/80 | HR 72 | Temp 98.0°F | Resp 16 | Ht 63.0 in | Wt 139.0 lb

## 2023-05-30 DIAGNOSIS — Z0000 Encounter for general adult medical examination without abnormal findings: Secondary | ICD-10-CM | POA: Diagnosis not present

## 2023-05-30 DIAGNOSIS — Z131 Encounter for screening for diabetes mellitus: Secondary | ICD-10-CM | POA: Diagnosis not present

## 2023-05-30 DIAGNOSIS — Z1231 Encounter for screening mammogram for malignant neoplasm of breast: Secondary | ICD-10-CM | POA: Diagnosis not present

## 2023-05-30 DIAGNOSIS — Z79899 Other long term (current) drug therapy: Secondary | ICD-10-CM | POA: Diagnosis not present

## 2023-05-30 DIAGNOSIS — Z23 Encounter for immunization: Secondary | ICD-10-CM

## 2023-05-30 DIAGNOSIS — E785 Hyperlipidemia, unspecified: Secondary | ICD-10-CM

## 2023-05-30 NOTE — Progress Notes (Signed)
 Name: Erin [name]   MRN: [id number]    DOB: [birth year]   Date:05/30/2023       Progress Note  Subjective  Chief Complaint  Chief Complaint  Patient presents with   Annual Exam    Pt states she has not been taking BP medicine due to running low at home    HPI  Patient presents for annual CPE.  Diet: eating a balanced diet , intermittent fasting  Exercise: discussed 150 minutes per week   Last Eye Exam: completed Last Dental Exam: completed  Flowsheet Row Office Visit from 05/30/2023 in [facility]  AUDIT-C Score 0      Depression: Phq 9 is  negative    05/30/2023    8:23 AM 05/28/2022    7:41 AM 06/27/2021    1:36 PM 05/24/2021    1:12 PM 10/14/2020    9:30 AM  Depression screen PHQ 2/9  Decreased Interest 0 0 0 0 0  Down, Depressed, Hopeless 0 0 0 0 0  PHQ - 2 Score 0 0 0 0 0  Altered sleeping 0 0 0 1   Tired, decreased energy 0 0 0 1   Change in appetite 0 0 0 0   Feeling bad or failure about yourself  0 0 0 0   Trouble concentrating 0 0 0 0   Moving slowly or fidgety/restless 0 0 0 0   Suicidal thoughts 0 0 0 0   PHQ-9 Score 0 0 0 2   Difficult doing work/chores Not difficult at all       Hypertension: BP Readings from Last 3 Encounters:  05/30/23 130/80  05/28/22 118/70  09/25/21 (!) 147/88   Obesity: Wt Readings from Last 3 Encounters:  05/30/23 139 lb (63 kg)  05/28/22 139 lb (63 kg)  09/25/21 138 lb (62.6 kg)   BMI Readings from Last 3 Encounters:  05/30/23 24.62 kg/m  05/28/22 24.62 kg/m  09/25/21 24.45 kg/m     Vaccines: reviewed with the patient. Discussed vaccines  Hep C Screening: completed STD testing and prevention (HIV/chl/gon/syphilis): N/A Intimate partner violence: negative screen  Sexual History : no problems  Menstrual History/LMP/Abnormal Bleeding: pos menopausal  Discussed importance of follow up if any post-menopausal bleeding: yes  Incontinence Symptoms: positive for symptoms , occasionally  when she waits long to use the bathroom - has to wear a panty liner daily  Breast cancer:  - Last Mammogram: up to date  - BRCA gene screening: N/A  Osteoporosis Prevention : Discussed high calcium and vitamin D supplementation, weight bearing exercises Bone density :not applicable   Cervical cancer screening: up-to-date  Skin cancer: Discussed monitoring for atypical lesions  Colorectal cancer: up to date    Lung cancer:  Low Dose CT Chest recommended if Age 53-80 years, 20 pack-year currently smoking OR have quit w/in 15years. Patient does not qualify for screen   ECG: 2018  Advanced Care Planning: A voluntary discussion about advance care planning including the explanation and discussion of advance directives.  Discussed health care proxy and Living will, and the patient was able to identify a health care proxy as husband .  Patient does not have a living will and power of attorney of health care   Patient Active Problem List   Diagnosis Date Noted   Leg pain, posterior, right 08/05/2021   PAD (peripheral artery disease) (HCC) 08/02/2021   History of shingles 10/06/2020   Varicose veins with pain 07/05/2020   Lymphedema 07/04/2020  Chronic venous insufficiency 07/04/2020   Closed displaced fracture of phalanx of lesser toe of right foot 12/15/2018   Toe fracture, left 09/18/2018   Panic attack 10/10/2016   APC (atrial premature contractions) 05/27/2015   History of varicose veins 05/27/2015   Vitamin D deficiency 05/27/2015   Chronic neck pain 05/27/2015   Fibromyalgia 05/27/2015   Tachycardia 02/05/2012   Essential (primary) hypertension 11/04/2008    Past Surgical History:  Procedure Laterality Date   CESAREAN SECTION  1993    Family History  Problem Relation Age of Onset   Melanoma Mother    Cancer Father 88       lung cancer   Heart attack Father 68   Hypertension Father    Heart attack Brother 51       s/p stent    Hyperlipidemia Brother    Hypertension  Brother    Breast cancer Neg Hx     Social History   Socioeconomic History   Marital status: Married    Spouse name: Not on file   Number of children: 3   Years of education: Not on file   Highest education level: Bachelor's degree (e.g., BA, AB, BS)  Occupational History   Occupation: Child psychotherapist  Tobacco Use   Smoking status: Never   Smokeless tobacco: Never  Vaping Use   Vaping status: Never Used  Substance and Sexual Activity   Alcohol use: No    Alcohol/week: 0.0 standard drinks of alcohol   Drug use: No   Sexual activity: Yes    Partners: Male    Birth control/protection: Surgical  Other Topics Concern   Not on file  Social History Narrative   Working from home   One grown child moved back with them in the Summer of 2023 with his wife and 4 children   Social Drivers of Corporate investment banker Strain: Low Risk  (05/28/2023)   Overall Financial Resource Strain (CARDIA)    Difficulty of Paying Living Expenses: Not hard at all  Food Insecurity: No Food Insecurity (05/28/2023)   Hunger Vital Sign    Worried About Running Out of Food in the Last Year: Never true    Ran Out of Food in the Last Year: Never true  Transportation Needs: No Transportation Needs (05/28/2023)   PRAPARE - Administrator, Civil Service (Medical): No    Lack of Transportation (Non-Medical): No  Physical Activity: Insufficiently Active (05/28/2023)   Exercise Vital Sign    Days of Exercise per Week: 3 days    Minutes of Exercise per Session: 20 min  Stress: No Stress Concern Present (05/28/2023)   [employer] of Occupational Health - Occupational Stress Questionnaire    Feeling of Stress : Not at all  Social Connections: Socially Integrated (05/28/2023)   Social Connection and Isolation Panel [NHANES]    Frequency of Communication with Friends and Family: More than three times a week    Frequency of Social Gatherings with Friends and Family: Three times a week    Attends  Religious Services: More than 4 times per year    Active Member of Clubs or Organizations: Yes    Attends Banker Meetings: More than 4 times per year    Marital Status: Married  Catering manager Violence: Not At Risk (05/30/2023)   Humiliation, Afraid, Rape, and Kick questionnaire    Fear of Current or Ex-Partner: No    Emotionally Abused: No    Physically Abused: No  Sexually Abused: No     Current Outpatient Medications:    Multiple Vitamin (MULTIVITAMIN) tablet, Take 1 tablet by mouth daily., Disp: , Rfl:    hydrochlorothiazide (MICROZIDE) 12.5 MG capsule, Take 1 capsule (12.5 mg total) by mouth daily. (Patient not taking: Reported on 05/30/2023), Disp: 90 capsule, Rfl: 0   metoprolol succinate (TOPROL-XL) 25 MG 24 hr tablet, Take 1 tablet (25 mg total) by mouth daily. (Patient not taking: Reported on 05/30/2023), Disp: 30 tablet, Rfl: 0  No Known Allergies   ROS  Constitutional: Negative for fever or weight change.  Respiratory: Negative for cough and shortness of breath.   Cardiovascular: Negative for chest pain or palpitations.  Gastrointestinal: Negative for abdominal pain, no bowel changes.  Musculoskeletal: Negative for gait problem or joint swelling.  Skin: Negative for rash.  Neurological: Negative for dizziness or headache.  No other specific complaints in a complete review of systems (except as listed in HPI above).   Objective  Vitals:   05/30/23 0824 05/30/23 0917  BP: (!) 152/88 130/80  Pulse: 72   Resp: 16   Temp: 98 F (36.7 C)   TempSrc: Oral   SpO2: 97%   Weight: 139 lb (63 kg)   Height: 5\' 3"  (1.6 m)     Body mass index is 24.62 kg/m.  Physical Exam  Constitutional: Patient appears well-developed and well-nourished. No distress.  HENT: Head: Normocephalic and atraumatic. Ears: B TMs ok, no erythema or effusion; Nose: Nose normal. Mouth/Throat: Oropharynx is clear and moist. No oropharyngeal exudate.  Eyes: Conjunctivae and EOM  are normal. Pupils are equal, round, and reactive to light. No scleral icterus.  Neck: Normal range of motion. Neck supple. No JVD present. No thyromegaly present.  Cardiovascular: Normal rate, regular rhythm and normal heart sounds.  No murmur heard. No BLE edema. Pulmonary/Chest: Effort normal and breath sounds normal. No respiratory distress. Abdominal: Soft. Bowel sounds are normal, no distension. There is no tenderness. no masses Breast: no lumps or masses, no nipple discharge or rashes FEMALE GENITALIA:  Not done  RECTAL: not done  Musculoskeletal: Normal range of motion, no joint effusions. No gross deformities Neurological: he is alert and oriented to person, place, and time. No cranial nerve deficit. Coordination, balance, strength, speech and gait are normal.  Skin: Skin is warm and dry. No rash noted. No erythema.  Psychiatric: Patient has a normal mood and affect. behavior is normal. Judgment and thought content normal.     Assessment & Plan  1. Well adult exam (Primary)  - Lipid panel - CBC with Differential/Platelet - COMPLETE METABOLIC PANEL WITH GFR - Hemoglobin A1c  2. Breast cancer screening by mammogram  - MM 3D SCREENING MAMMOGRAM BILATERAL BREAST; Future  3. Long-term use of high-risk medication  - CBC with Differential/Platelet - COMPLETE METABOLIC PANEL WITH GFR  4. Dyslipidemia  - Lipid panel  5. Diabetes mellitus screening  - Hemoglobin A1c   6. Need for vaccination with 20-polyvalent pneumococcal conjugate vaccine  She will check with insurance    -USPSTF grade A and B recommendations reviewed with patient; age-appropriate recommendations, preventive care, screening tests, etc discussed and encouraged; healthy living encouraged; see AVS for patient education given to patient -Discussed importance of 150 minutes of physical activity weekly, eat two servings of fish weekly, eat one serving of tree nuts ( cashews, pistachios, pecans, almonds.Marland Kitchen)  every other day, eat 6 servings of fruit/vegetables daily and drink plenty of water and avoid sweet beverages.   -Reviewed Health  Maintenance: Yes.

## 2023-05-31 ENCOUNTER — Encounter: Payer: Self-pay | Admitting: Family Medicine

## 2023-05-31 LAB — CBC WITH DIFFERENTIAL/PLATELET
Absolute Lymphocytes: 1705 {cells}/uL (ref 850–3900)
Absolute Monocytes: 580 {cells}/uL (ref 200–950)
Basophils Absolute: 30 {cells}/uL (ref 0–200)
Basophils Relative: 0.6 %
Eosinophils Absolute: 90 {cells}/uL (ref 15–500)
Eosinophils Relative: 1.8 %
HCT: 40.6 % (ref 35.0–45.0)
Hemoglobin: 13.6 g/dL (ref 11.7–15.5)
MCH: 32.4 pg (ref 27.0–33.0)
MCHC: 33.5 g/dL (ref 32.0–36.0)
MCV: 96.7 fL (ref 80.0–100.0)
MPV: 10.7 fL (ref 7.5–12.5)
Monocytes Relative: 11.6 %
Neutro Abs: 2595 {cells}/uL (ref 1500–7800)
Neutrophils Relative %: 51.9 %
Platelets: 261 10*3/uL (ref 140–400)
RBC: 4.2 10*6/uL (ref 3.80–5.10)
RDW: 12.5 % (ref 11.0–15.0)
Total Lymphocyte: 34.1 %
WBC: 5 10*3/uL (ref 3.8–10.8)

## 2023-05-31 LAB — LIPID PANEL
Cholesterol: 209 mg/dL — ABNORMAL HIGH (ref <200)
HDL: 57 mg/dL (ref >=50)
LDL Cholesterol (Calc): 137 mg/dL — ABNORMAL HIGH
Non-HDL Cholesterol (Calc): 152 mg/dL — ABNORMAL HIGH (ref <130)
Total CHOL/HDL Ratio: 3.7 (calc) (ref <5.0)
Triglycerides: 48 mg/dL (ref <150)

## 2023-05-31 LAB — COMPLETE METABOLIC PANEL WITH GFR
AG Ratio: 1.5 (calc) (ref 1.0–2.5)
ALT: 10 U/L (ref 6–29)
AST: 17 U/L (ref 10–35)
Albumin: 4.4 g/dL (ref 3.6–5.1)
Alkaline phosphatase (APISO): 72 U/L (ref 37–153)
BUN: 12 mg/dL (ref 7–25)
CO2: 31 mmol/L (ref 20–32)
Calcium: 9.6 mg/dL (ref 8.6–10.4)
Chloride: 103 mmol/L (ref 98–110)
Creat: 0.88 mg/dL (ref 0.50–1.03)
Globulin: 2.9 g/dL (ref 1.9–3.7)
Glucose, Bld: 86 mg/dL (ref 65–99)
Potassium: 4.2 mmol/L (ref 3.5–5.3)
Sodium: 140 mmol/L (ref 135–146)
Total Bilirubin: 0.6 mg/dL (ref 0.2–1.2)
Total Protein: 7.3 g/dL (ref 6.1–8.1)

## 2023-05-31 LAB — HEMOGLOBIN A1C
Hgb A1c MFr Bld: 5.3 %{Hb} (ref <5.7)
Mean Plasma Glucose: 105 mg/dL
eAG (mmol/L): 5.8 mmol/L

## 2023-07-05 ENCOUNTER — Ambulatory Visit: Admitting: Family Medicine

## 2023-09-01 ENCOUNTER — Other Ambulatory Visit: Payer: Self-pay | Admitting: Family Medicine

## 2023-09-01 DIAGNOSIS — I10 Essential (primary) hypertension: Secondary | ICD-10-CM

## 2024-01-08 ENCOUNTER — Other Ambulatory Visit: Payer: Self-pay | Admitting: Family Medicine

## 2024-01-08 DIAGNOSIS — I10 Essential (primary) hypertension: Secondary | ICD-10-CM

## 2024-01-08 DIAGNOSIS — R000 Tachycardia, unspecified: Secondary | ICD-10-CM

## 2024-02-05 ENCOUNTER — Ambulatory Visit: Payer: Self-pay

## 2024-02-05 NOTE — Telephone Encounter (Signed)
 We are completely booked is Dr Glenard willing to work her in a same day slot?

## 2024-02-05 NOTE — Telephone Encounter (Signed)
 FYI Only or Action Required?: FYI only for provider: appt in Jan- call if sooner appt with Dr Glenard only.  Patient was last seen in primary care on 05/30/2023 by Glenard Mire, MD.  Called Nurse Triage reporting Abdominal Pain.  Symptoms began several months ago.  Interventions attempted: OTC medications: miralax, tea, otc .  Symptoms are: stable.  Triage Disposition: Home Care  Patient/caregiver understands and will follow disposition?: Yes Message from [facility] sent at 02/05/2024  9:51 AM EST  Reason for Triage: Pt scheduled herself on 01/13 through mychart. And pt wanted to be seen sooner  Pt having abdominal pain and bowl issues.   Callback [phone]   Reason for Disposition  [1] MILD-MODERATE pain AND [2] comes and goes (cramps)  Answer Assessment - Initial Assessment Questions Several months of bowel habit changes and abdominal cramping.  Starting in June-Bowel habits changed- stool turned green- adjusted diet more- yogurt. Resolved. Then started having frequent constipation. Has high fiber diet, has good oral intake and mostly water. Started using laxative to help. Will go then not have one for a few days to a week.  Patient cramping when constipated. More uncomfortable than pain. Relieved with bowel movement. Denies blood in stool, CP, SOB, Dizziness, or fever.  Has appt on the waitlist currently scheduled in January. Only wants to see Dr Glenard. She understands it is not emergent but if can be seen sooner please let her know. ED/UC/Callback precautions advised. She understands  1. LOCATION: Where does it hurt?      Down in the gut 2. RADIATION: Does the pain shoot anywhere else? (e.g., chest, back)     denies 3. ONSET: When did the pain begin? (e.g., minutes, hours or days ago)      Starting in June  4. SUDDEN: Gradual or sudden onset?     Not frequent- more with constipation  5. PATTERN Does the pain come and go, or is it constant?     Comes  6.  SEVERITY: How bad is the pain?  (e.g., Scale 1-10; mild, moderate, or severe)     Uncomfortable cramping  7. RECURRENT SYMPTOM: Have you ever had this type of stomach pain before? If Yes, ask: When was the last time? and What happened that time?      With constipation  8. CAUSE: What do you think is causing the stomach pain? (e.g., gallstones, recent abdominal surgery)     Not completely emptying  9. RELIEVING/AGGRAVATING FACTORS: What makes it better or worse? (e.g., antacids, bending or twisting motion, bowel movement)     Relief with BM 10. OTHER SYMPTOMS: Do you have any other symptoms? (e.g., back pain, diarrhea, fever, urination pain, vomiting)       Constipation  Protocols used: Abdominal Pain - Female-A-AH

## 2024-02-10 ENCOUNTER — Ambulatory Visit: Admitting: Family Medicine

## 2024-02-10 NOTE — Telephone Encounter (Signed)
 Pt had appt sch'd for today and cancelled.

## 2024-03-24 ENCOUNTER — Ambulatory Visit: Admitting: Family Medicine

## 2024-04-16 ENCOUNTER — Telehealth: Admitting: Family Medicine

## 2024-04-16 DIAGNOSIS — J0100 Acute maxillary sinusitis, unspecified: Secondary | ICD-10-CM

## 2024-04-16 MED ORDER — AMOXICILLIN-POT CLAVULANATE 875-125 MG PO TABS: 1.0000 | ORAL_TABLET | Freq: Two times a day (BID) | ORAL | 0 refills | Status: AC

## 2024-04-16 NOTE — Progress Notes (Signed)
 "  Name: Erin [name]   MRN: [id number]    DOB: Aug 05, 1970   Date:04/16/2024       Progress Note  Subjective  Chief Complaint  Chief Complaint  Patient presents with   Nasal Congestion    Sx ongoing for 1.5 week   Ear Pain   Sinusitis    Sinus pressure    I connected with  Erin [name]  on 04/16/24 at 10:00 AM EST by a video enabled telemedicine application and verified that I am speaking with the correct person using two identifiers.  I discussed the limitations of evaluation and management by telemedicine and the availability of in person appointments. The patient expressed understanding and agreed to proceed with a virtual visit  Staff also discussed with the patient that there may be a patient responsible charge related to this service. Patient Location: at home  Provider Location: [facility] Additional Individuals present: alone  Discussed the use of AI scribe software for clinical note transcription with the patient, who gave verbal consent to proceed.  History of Present Illness Erin [name] is a 54 year old female who presents with sore throat, headaches, and sinus drainage.  She has been experiencing a sore throat, headaches, and mucous drainage for about a week and a half. She reports that the left side of her face, near the nose, feels swollen. Despite a previous telehealth consultation where she was advised it was a common cold, the symptoms have not improved.  No fever or chills are present. Her appetite remains stable, and she has not experienced any changes in bowel movements.    Patient Active Problem List   Diagnosis Date Noted   Leg pain, posterior, right 08/05/2021   PAD (peripheral artery disease) 08/02/2021   History of shingles 10/06/2020   Varicose veins with pain 07/05/2020   Lymphedema 07/04/2020   Chronic venous insufficiency 07/04/2020   Closed displaced fracture of phalanx of lesser toe of right foot 12/15/2018   Toe fracture, left  09/18/2018   Panic attack 10/10/2016   APC (atrial premature contractions) 05/27/2015   History of varicose veins 05/27/2015   Vitamin D  deficiency 05/27/2015   Chronic neck pain 05/27/2015   Fibromyalgia 05/27/2015   Tachycardia 02/05/2012   Essential (primary) hypertension 11/04/2008    Social History   Tobacco Use   Smoking status: Never   Smokeless tobacco: Never  Substance Use Topics   Alcohol use: No    Alcohol/week: 0.0 standard drinks of alcohol    Current Medications[1]  Allergies[2]  I personally reviewed active problem list, medication list, allergies with the patient/caregiver today.  ROS  Ten systems reviewed and is negative except as mentioned in HPI    Objective  Virtual encounter, vitals not obtained.    Physical Exam  Assessment & Plan Acute maxillary sinusitis Left maxillary sinus involvement with worsening symptoms suggests bacterial sinusitis. - Prescribed Augmentin  875 mg for 10 days. - Advised increased fluid intake. - Recommended saline nasal washes or neti pot use. - Suggested over-the-counter loratadine for allergy symptoms. - Instructed to use Flonase, one squirt twice a day. - Sent prescription to [facility] on [address]     -Red flags and when to present for emergency care or RTC including fever >101.25F, chest pain, shortness of breath, new/worsening/un-resolving symptoms,  reviewed with patient at time of visit. Follow up and care instructions discussed and provided in AVS. - I discussed the assessment and treatment plan with the patient. The patient was provided  an opportunity to ask questions and all were answered. The patient agreed with the plan and demonstrated an understanding of the instructions.  I provided 15  minutes of non-face-to-face time during this encounter.  Krystopher Kuenzel F Gerrit Rafalski, MD      [1]  Current Outpatient Medications:    Multiple Vitamin (MULTIVITAMIN) tablet, Take 1 tablet by mouth daily., Disp: , Rfl:     hydrochlorothiazide  (MICROZIDE ) 12.5 MG capsule, Take 1 capsule (12.5 mg total) by mouth daily. (Patient not taking: Reported on 04/16/2024), Disp: 90 capsule, Rfl: 0   metoprolol  succinate (TOPROL -XL) 25 MG 24 hr tablet, Take 1 tablet (25 mg total) by mouth daily. (Patient not taking: Reported on 04/16/2024), Disp: 30 tablet, Rfl: 0 [2] No Known Allergies  "

## 2024-05-26 ENCOUNTER — Ambulatory Visit: Admitting: Dermatology

## 2024-06-02 ENCOUNTER — Encounter: Admitting: Family Medicine
